# Patient Record
Sex: Female | Born: 1935 | Race: White | Hispanic: No | Smoking: Never smoker
Health system: Southern US, Community
[De-identification: ages and names within clinical notes are randomized; demographics above are authoritative.]

## PROBLEM LIST (undated history)

## (undated) DIAGNOSIS — M2550 Pain in unspecified joint: Secondary | ICD-10-CM

## (undated) DIAGNOSIS — R351 Nocturia: Secondary | ICD-10-CM

## (undated) DIAGNOSIS — H269 Unspecified cataract: Secondary | ICD-10-CM

## (undated) DIAGNOSIS — M1990 Unspecified osteoarthritis, unspecified site: Secondary | ICD-10-CM

## (undated) DIAGNOSIS — R011 Cardiac murmur, unspecified: Secondary | ICD-10-CM

---

## 2011-10-28 ENCOUNTER — Encounter (HOSPITAL_COMMUNITY): Payer: Self-pay | Admitting: Pharmacy Technician

## 2011-11-04 ENCOUNTER — Ambulatory Visit (HOSPITAL_COMMUNITY)
Admission: RE | Admit: 2011-11-04 | Discharge: 2011-11-04 | Disposition: A | Discharge status: Home / Self Care | Payer: Medicare Other | Source: Ambulatory Visit | Attending: Surgery | Admitting: Surgery

## 2011-11-04 ENCOUNTER — Encounter (HOSPITAL_COMMUNITY)
Admission: RE | Admit: 2011-11-04 | Discharge: 2011-11-04 | Disposition: A | Discharge status: Home / Self Care | Payer: Medicare Other | Source: Ambulatory Visit | Attending: Orthopedic Surgery | Admitting: Orthopedic Surgery

## 2011-11-04 ENCOUNTER — Encounter (HOSPITAL_COMMUNITY): Payer: Self-pay

## 2011-11-04 ENCOUNTER — Other Ambulatory Visit: Payer: Self-pay

## 2011-11-04 DIAGNOSIS — Z01810 Encounter for preprocedural cardiovascular examination: Secondary | ICD-10-CM | POA: Insufficient documentation

## 2011-11-04 DIAGNOSIS — K449 Diaphragmatic hernia without obstruction or gangrene: Secondary | ICD-10-CM | POA: Insufficient documentation

## 2011-11-04 DIAGNOSIS — Z01818 Encounter for other preprocedural examination: Secondary | ICD-10-CM | POA: Insufficient documentation

## 2011-11-04 DIAGNOSIS — Z01812 Encounter for preprocedural laboratory examination: Secondary | ICD-10-CM | POA: Insufficient documentation

## 2011-11-04 HISTORY — DX: Nocturia: R35.1

## 2011-11-04 HISTORY — DX: Unspecified cataract: H26.9

## 2011-11-04 HISTORY — DX: Cardiac murmur, unspecified: R01.1

## 2011-11-04 HISTORY — DX: Unspecified osteoarthritis, unspecified site: M19.90

## 2011-11-04 HISTORY — DX: Pain in unspecified joint: M25.50

## 2011-11-04 LAB — CBC
MCV: 86.6 fL (ref 78.0–100.0)
Platelets: 390 10*3/uL (ref 150–400)
RBC: 4.39 MIL/uL (ref 3.87–5.11)
RDW: 13.4 % (ref 11.5–15.5)
WBC: 11 10*3/uL — ABNORMAL HIGH (ref 4.0–10.5)

## 2011-11-04 LAB — URINALYSIS, ROUTINE W REFLEX MICROSCOPIC
Bilirubin Urine: NEGATIVE
Glucose, UA: NEGATIVE mg/dL
Ketones, ur: NEGATIVE mg/dL
Nitrite: NEGATIVE
Specific Gravity, Urine: 1.018 (ref 1.005–1.030)
pH: 6 (ref 5.0–8.0)

## 2011-11-04 LAB — COMPREHENSIVE METABOLIC PANEL
ALT: 13 U/L (ref 0–35)
AST: 16 U/L (ref 0–37)
Albumin: 3.7 g/dL (ref 3.5–5.2)
Alkaline Phosphatase: 106 U/L (ref 39–117)
CO2: 21 mEq/L (ref 19–32)
Chloride: 102 mEq/L (ref 96–112)
GFR calc non Af Amer: 70 mL/min — ABNORMAL LOW (ref >90)
Potassium: 3.7 mEq/L (ref 3.5–5.1)
Sodium: 136 mEq/L (ref 135–145)
Total Bilirubin: 0.3 mg/dL (ref 0.3–1.2)

## 2011-11-04 LAB — URINE MICROSCOPIC-ADD ON

## 2011-11-04 LAB — APTT: aPTT: 33 seconds (ref 24–37)

## 2011-11-04 LAB — TYPE AND SCREEN
ABO/RH(D): O POS
Antibody Screen: NEGATIVE

## 2011-11-04 LAB — SURGICAL PCR SCREEN: Staphylococcus aureus: POSITIVE — AB

## 2011-11-04 LAB — ABO/RH: ABO/RH(D): O POS

## 2011-11-04 NOTE — Progress Notes (Signed)
Pt has never had a stress test/echo/heart cath 

## 2011-11-04 NOTE — Pre-Procedure Instructions (Signed)
20 Courtney Proctor  11/04/2011   Your procedure is scheduled on:  Wed, Jan 30 @ 0830  Report to [facility] at 0630 AM.  Call this number if you have problems the morning of surgery: [phone]   Remember:   Do not eat food:After Midnight.  May have clear liquids: up to 4 Hours before arrival.(until 2:30 am)  Clear liquids include soda, tea, black coffee, apple or grape juice, broth.  Take these medicines the morning of surgery with A SIP OF WATER: Tramadol(if needed)   Do not wear jewelry, make-up or nail polish.  Do not wear lotions, powders, or perfumes. You may wear deodorant.  Do not shave 48 hours prior to surgery.  Do not bring valuables to the hospital.  Contacts, dentures or bridgework may not be worn into surgery.  Leave suitcase in the car. After surgery it may be brought to your room.  For patients admitted to the hospital, checkout time is 11:00 AM the day of discharge.   Patients discharged the day of surgery will not be allowed to drive home.  Name and phone number of your driver:   Special Instructions: CHG Shower Use Special Wash: 1/2 bottle night before surgery and 1/2 bottle morning of surgery.   Please read over the following fact sheets that you were given: Pain Booklet, Coughing and Deep Breathing, Blood Transfusion Information, Total Joint Packet, MRSA Information and Surgical Site Infection Prevention

## 2011-11-05 NOTE — H&P (Signed)
  [facility] [address]   [address] [place], [place] [address] [phone] A Division of [facility]  Loreta Ave, M.D.     Robert A. Thurston Hole, M.D.     Lunette Stands, M.D. Eulas Post, M.D.    Buford Dresser, M.D. Estell Harpin, M.D. Ralene Cork, D.O.          Genene Churn. Barry Dienes, PA-C            Kirstin A. Shepperson, PA-C [place], OPA-C   RE: Courtney Proctor, Courtney Proctor                                [id number]      DOB: 1936-04-02 PROGRESS NOTE: 10-31-11 Chief complaint: Left hip pain.  History of present illness: 62 three year-old white female with a history of end stage DJD, left hip, and chronic pain.  Returns.  States that hip symptoms are unchanged from previous visit.  She is wanting to proceed with total hip replacement as scheduled.  We received pre-op medical clearance from Dr. Renard Matter and she was last seen by him in early December.  No complaints of chest pain, shortness of breath, lightheadedness or dizziness.   Current medications: None listed. Allergies: No known drug allergies. Past medical/surgical history: None listed. Review of systems: All other systems are unremarkable.  See HPI.  Family history: Father had a brain tumor. Social history: Does not smoke or drink.  Patient is a widow.        EXAMINATION: Height: 4?11.  Weight: 156 pounds.  Pulse: 85.  Temperature: 98.3.  Blood pressure: Automatic 208/79 and rechecked 201/80.  Manual: 180/82 and rechecked 180/84.  Pleasant, elderly white female, alert and oriented x 3 and in no acute distress.  Head is normocephalic, a traumatic.  PERRLA, EOMI.  Cervical spine unremarkable.  Lungs: CTA bilaterally.  No wheezes.  Heart: RRR.  S1 and S2.  No murmurs.  Abdomen: Round and non-distended.  NBS x 4.  Soft and non-tender.  Left hip: Decreased range of  motion.  Positive log roll.  Neurovascularly intact.  Skin warm and dry.  Gait antalgic.       IMPRESSION: 1. End stage DJD, left hip, and chronic pain.  Failed conservative treatment.   2. Hypertension.  PLAN:  Recommended to patient and her daughter, who was present, that she needs follow up with Dr. Renard Matter next Monday for evaluation of her blood pressure.  I did call Dr. Renard Matter' office and they said it would be okay for her to be seen next week.  We will proceed with pre-op workup as scheduled.  All questions answered.    Loreta Ave, M.D.   Electronically verified by Loreta Ave, M.D. DFM(JMO):jjh D 11-03-11 T 11-03-11

## 2011-11-11 MED ORDER — CEFAZOLIN SODIUM 1-5 GM-% IV SOLN
1.0000 g | INTRAVENOUS | Status: AC
  Administered 2011-11-12: 1 g via INTRAVENOUS

## 2011-11-12 ENCOUNTER — Inpatient Hospital Stay (HOSPITAL_COMMUNITY)
Admission: RE | Admit: 2011-11-12 | Discharge: 2011-11-14 | DRG: 470 | Disposition: A | Discharge status: Home Health | Payer: Medicare Other | Source: Ambulatory Visit | Attending: Orthopedic Surgery | Admitting: Orthopedic Surgery

## 2011-11-12 ENCOUNTER — Encounter (HOSPITAL_COMMUNITY): Payer: Self-pay | Admitting: Anesthesiology

## 2011-11-12 ENCOUNTER — Encounter (HOSPITAL_COMMUNITY)
Admission: RE | Disposition: A | Discharge status: Home Health | Payer: Self-pay | Source: Ambulatory Visit | Attending: Orthopedic Surgery

## 2011-11-12 ENCOUNTER — Encounter (HOSPITAL_COMMUNITY): Payer: Self-pay | Admitting: Surgery

## 2011-11-12 ENCOUNTER — Ambulatory Visit (HOSPITAL_COMMUNITY): Payer: Medicare Other

## 2011-11-12 ENCOUNTER — Ambulatory Visit (HOSPITAL_COMMUNITY): Payer: Medicare Other | Admitting: Anesthesiology

## 2011-11-12 DIAGNOSIS — Z471 Aftercare following joint replacement surgery: Secondary | ICD-10-CM

## 2011-11-12 DIAGNOSIS — M169 Osteoarthritis of hip, unspecified: Principal | ICD-10-CM | POA: Diagnosis present

## 2011-11-12 DIAGNOSIS — Z01812 Encounter for preprocedural laboratory examination: Secondary | ICD-10-CM

## 2011-11-12 DIAGNOSIS — M1610 Unilateral primary osteoarthritis, unspecified hip: Principal | ICD-10-CM | POA: Diagnosis present

## 2011-11-12 DIAGNOSIS — I10 Essential (primary) hypertension: Secondary | ICD-10-CM | POA: Diagnosis present

## 2011-11-12 HISTORY — PX: TOTAL HIP ARTHROPLASTY: SHX124

## 2011-11-12 SURGERY — ARTHROPLASTY, HIP, TOTAL,POSTERIOR APPROACH
Anesthesia: General | Site: Hip | Laterality: Left | Wound class: Clean

## 2011-11-12 MED ORDER — HYDROMORPHONE HCL PF 1 MG/ML IJ SOLN: 0.2500 mg | INTRAMUSCULAR | Status: DC | PRN

## 2011-11-12 MED ORDER — ACETAMINOPHEN 325 MG PO TABS: 650.0000 mg | ORAL_TABLET | Freq: Four times a day (QID) | ORAL | Status: DC | PRN

## 2011-11-12 MED ORDER — MIDAZOLAM HCL 5 MG/5ML IJ SOLN
INTRAMUSCULAR | Status: DC | PRN
  Administered 2011-11-12: 1 mg via INTRAVENOUS

## 2011-11-12 MED ORDER — HYDROMORPHONE HCL PF 1 MG/ML IJ SOLN
0.5000 mg | INTRAMUSCULAR | Status: DC | PRN
  Administered 2011-11-13 (×2): 0.5 mg via INTRAVENOUS
  Filled 2011-11-12: qty 1

## 2011-11-12 MED ORDER — FENTANYL CITRATE 0.05 MG/ML IJ SOLN
INTRAMUSCULAR | Status: DC | PRN
  Administered 2011-11-12: 100 ug via INTRAVENOUS
  Administered 2011-11-12 (×5): 50 ug via INTRAVENOUS

## 2011-11-12 MED ORDER — MEPERIDINE HCL 25 MG/ML IJ SOLN: 6.2500 mg | INTRAMUSCULAR | Status: DC | PRN

## 2011-11-12 MED ORDER — PROPOFOL 10 MG/ML IV EMUL
INTRAVENOUS | Status: DC | PRN
  Administered 2011-11-12: 130 mg via INTRAVENOUS

## 2011-11-12 MED ORDER — WARFARIN VIDEO
Freq: Once | Status: AC
  Administered 2011-11-12: 22:00:00

## 2011-11-12 MED ORDER — NEOSTIGMINE METHYLSULFATE 1 MG/ML IJ SOLN
INTRAMUSCULAR | Status: DC | PRN
  Administered 2011-11-12: 4 mg via INTRAVENOUS

## 2011-11-12 MED ORDER — POTASSIUM CHLORIDE IN NACL 20-0.9 MEQ/L-% IV SOLN
INTRAVENOUS | Status: DC
  Administered 2011-11-12 – 2011-11-13 (×2): via INTRAVENOUS
  Filled 2011-11-12 (×5): qty 1000

## 2011-11-12 MED ORDER — SCOPOLAMINE 1 MG/3DAYS TD PT72
MEDICATED_PATCH | TRANSDERMAL | Status: DC | PRN
  Administered 2011-11-12: 1 via TRANSDERMAL

## 2011-11-12 MED ORDER — ACETAMINOPHEN 650 MG RE SUPP: 650.0000 mg | Freq: Four times a day (QID) | RECTAL | Status: DC | PRN

## 2011-11-12 MED ORDER — ROCURONIUM BROMIDE 100 MG/10ML IV SOLN
INTRAVENOUS | Status: DC | PRN
  Administered 2011-11-12: 40 mg via INTRAVENOUS

## 2011-11-12 MED ORDER — CEFAZOLIN SODIUM 1-5 GM-% IV SOLN
1.0000 g | Freq: Four times a day (QID) | INTRAVENOUS | Status: AC
  Administered 2011-11-12 – 2011-11-13 (×3): 1 g via INTRAVENOUS
  Filled 2011-11-12 (×3): qty 50

## 2011-11-12 MED ORDER — OXYCODONE-ACETAMINOPHEN 5-325 MG PO TABS
1.0000 | ORAL_TABLET | ORAL | Status: DC | PRN
  Administered 2011-11-12: 1 via ORAL
  Administered 2011-11-13 (×2): 2 via ORAL
  Administered 2011-11-13: 1 via ORAL
  Administered 2011-11-13 – 2011-11-14 (×4): 2 via ORAL
  Filled 2011-11-12: qty 2
  Filled 2011-11-12: qty 1
  Filled 2011-11-12 (×4): qty 2
  Filled 2011-11-12: qty 1
  Filled 2011-11-12: qty 2

## 2011-11-12 MED ORDER — DOCUSATE SODIUM 100 MG PO CAPS
100.0000 mg | ORAL_CAPSULE | Freq: Two times a day (BID) | ORAL | Status: DC
  Administered 2011-11-13 – 2011-11-14 (×2): 100 mg via ORAL
  Filled 2011-11-12 (×4): qty 1

## 2011-11-12 MED ORDER — FLEET ENEMA 7-19 GM/118ML RE ENEM: 1.0000 | ENEMA | Freq: Once | RECTAL | Status: AC | PRN

## 2011-11-12 MED ORDER — PROMETHAZINE HCL 25 MG/ML IJ SOLN: 6.2500 mg | INTRAMUSCULAR | Status: DC | PRN

## 2011-11-12 MED ORDER — ONDANSETRON HCL 4 MG PO TABS: 4.0000 mg | ORAL_TABLET | Freq: Four times a day (QID) | ORAL | Status: DC | PRN

## 2011-11-12 MED ORDER — PHENOL 1.4 % MT LIQD
1.0000 | OROMUCOSAL | Status: DC | PRN
  Filled 2011-11-12: qty 177

## 2011-11-12 MED ORDER — DEXAMETHASONE SODIUM PHOSPHATE 4 MG/ML IJ SOLN
INTRAMUSCULAR | Status: DC | PRN
  Administered 2011-11-12: 8 mg via INTRAVENOUS

## 2011-11-12 MED ORDER — ONDANSETRON HCL 4 MG/2ML IJ SOLN: 4.0000 mg | Freq: Four times a day (QID) | INTRAMUSCULAR | Status: DC | PRN

## 2011-11-12 MED ORDER — MENTHOL 3 MG MT LOZG: 1.0000 | LOZENGE | OROMUCOSAL | Status: DC | PRN

## 2011-11-12 MED ORDER — LACTATED RINGERS IV SOLN: INTRAVENOUS | Status: DC

## 2011-11-12 MED ORDER — LIDOCAINE HCL (CARDIAC) 20 MG/ML IV SOLN
INTRAVENOUS | Status: DC | PRN
  Administered 2011-11-12: 100 mg via INTRAVENOUS

## 2011-11-12 MED ORDER — GLYCOPYRROLATE 0.2 MG/ML IJ SOLN
INTRAMUSCULAR | Status: DC | PRN
Start: 2011-11-12 — End: 2011-11-12
  Administered 2011-11-12: .7 mg via INTRAVENOUS

## 2011-11-12 MED ORDER — MORPHINE SULFATE 10 MG/ML IJ SOLN: 0.0500 mg/kg | INTRAMUSCULAR | Status: DC | PRN

## 2011-11-12 MED ORDER — LACTATED RINGERS IV SOLN
INTRAVENOUS | Status: DC | PRN
  Administered 2011-11-12 (×2): via INTRAVENOUS

## 2011-11-12 MED ORDER — BUPIVACAINE HCL 0.5 % IJ SOLN
INTRAMUSCULAR | Status: DC | PRN
  Administered 2011-11-12: 20 mL

## 2011-11-12 MED ORDER — ENOXAPARIN SODIUM 40 MG/0.4ML ~~LOC~~ SOLN
40.0000 mg | Freq: Every day | SUBCUTANEOUS | Status: DC
  Administered 2011-11-12 – 2011-11-13 (×2): 40 mg via SUBCUTANEOUS
  Filled 2011-11-12 (×3): qty 0.4

## 2011-11-12 MED ORDER — MORPHINE SULFATE 2 MG/ML IJ SOLN: 0.0500 mg/kg | INTRAMUSCULAR | Status: DC | PRN

## 2011-11-12 MED ORDER — CHLORHEXIDINE GLUCONATE CLOTH 2 % EX PADS
6.0000 | MEDICATED_PAD | Freq: Every day | CUTANEOUS | Status: DC
  Administered 2011-11-13 – 2011-11-14 (×2): 6 via TOPICAL

## 2011-11-12 MED ORDER — SODIUM CHLORIDE 0.9 % IR SOLN
Status: DC | PRN
  Administered 2011-11-12: 1000 mL

## 2011-11-12 MED ORDER — METHOCARBAMOL 100 MG/ML IJ SOLN
500.0000 mg | Freq: Four times a day (QID) | INTRAVENOUS | Status: DC | PRN
  Filled 2011-11-12: qty 5

## 2011-11-12 MED ORDER — METHOCARBAMOL 500 MG PO TABS
500.0000 mg | ORAL_TABLET | Freq: Four times a day (QID) | ORAL | Status: DC | PRN
  Administered 2011-11-12 – 2011-11-13 (×3): 500 mg via ORAL
  Filled 2011-11-12 (×3): qty 1

## 2011-11-12 MED ORDER — WARFARIN SODIUM 5 MG PO TABS
5.0000 mg | ORAL_TABLET | Freq: Once | ORAL | Status: AC
  Administered 2011-11-12: 5 mg via ORAL
  Filled 2011-11-12: qty 1

## 2011-11-12 MED ORDER — MUPIROCIN 2 % EX OINT
1.0000 "application " | TOPICAL_OINTMENT | Freq: Two times a day (BID) | CUTANEOUS | Status: DC
  Administered 2011-11-11 – 2011-11-14 (×5): 1 via NASAL
  Filled 2011-11-12: qty 22

## 2011-11-12 MED ORDER — COUMADIN BOOK
Freq: Once | Status: AC
  Administered 2011-11-12: 22:00:00
  Filled 2011-11-12: qty 1

## 2011-11-12 SURGICAL SUPPLY — 62 items
BLADE SAW SAG 73X25 THK (BLADE) ×1
BLADE SAW SGTL 73X25 THK (BLADE) ×1 IMPLANT
BOOTCOVER CLEANROOM LRG (PROTECTIVE WEAR) ×4 IMPLANT
BRUSH FEMORAL CANAL (MISCELLANEOUS) IMPLANT
CLOTH BEACON ORANGE TIMEOUT ST (SAFETY) ×2 IMPLANT
COVER BACK TABLE 24X17X13 BIG (DRAPES) IMPLANT
COVER SURGICAL LIGHT HANDLE (MISCELLANEOUS) ×2 IMPLANT
DRAPE INCISE IOBAN 66X45 STRL (DRAPES) IMPLANT
DRAPE ORTHO SPLIT 77X108 STRL (DRAPES) ×2
DRAPE SURG ORHT 6 SPLT 77X108 (DRAPES) ×2 IMPLANT
DRAPE U-SHAPE 47X51 STRL (DRAPES) ×2 IMPLANT
DRILL BIT 7/64X5 (BIT) ×2 IMPLANT
DRSG PAD ABDOMINAL 8X10 ST (GAUZE/BANDAGES/DRESSINGS) ×2 IMPLANT
DURAPREP 26ML APPLICATOR (WOUND CARE) ×2 IMPLANT
ELECT BLADE 6.5 EXT (BLADE) IMPLANT
ELECT CAUTERY BLADE 6.4 (BLADE) ×2 IMPLANT
ELECT REM PT RETURN 9FT ADLT (ELECTROSURGICAL) ×2
ELECTRODE REM PT RTRN 9FT ADLT (ELECTROSURGICAL) ×1 IMPLANT
EVACUATOR 1/8 PVC DRAIN (DRAIN) IMPLANT
FACESHIELD LNG OPTICON STERILE (SAFETY) ×4 IMPLANT
GAUZE SPONGE 4X4 12PLY STRL LF (GAUZE/BANDAGES/DRESSINGS) ×2 IMPLANT
GAUZE XEROFORM 5X9 LF (GAUZE/BANDAGES/DRESSINGS) ×2 IMPLANT
GLOVE BIOGEL PI IND STRL 8 (GLOVE) ×1 IMPLANT
GLOVE BIOGEL PI INDICATOR 8 (GLOVE) ×1
GLOVE ORTHO TXT STRL SZ7.5 (GLOVE) ×4 IMPLANT
GOWN PREVENTION PLUS XLARGE (GOWN DISPOSABLE) ×2 IMPLANT
GOWN STRL NON-REIN LRG LVL3 (GOWN DISPOSABLE) ×2 IMPLANT
GOWN STRL REIN 2XL XLG LVL4 (GOWN DISPOSABLE) ×2 IMPLANT
HANDPIECE INTERPULSE COAX TIP (DISPOSABLE)
KIT BASIN OR (CUSTOM PROCEDURE TRAY) ×2 IMPLANT
KIT ROOM TURNOVER OR (KITS) ×2 IMPLANT
MANIFOLD NEPTUNE II (INSTRUMENTS) ×2 IMPLANT
NEEDLE HYPO 25GX1X1/2 BEV (NEEDLE) ×2 IMPLANT
NS IRRIG 1000ML POUR BTL (IV SOLUTION) ×2 IMPLANT
PACK TOTAL JOINT (CUSTOM PROCEDURE TRAY) ×2 IMPLANT
PAD ARMBOARD 7.5X6 YLW CONV (MISCELLANEOUS) ×4 IMPLANT
PASSER SUT SWANSON 36MM LOOP (INSTRUMENTS) ×2 IMPLANT
PILLOW ABDUCTION HIP (SOFTGOODS) ×2 IMPLANT
PRESSURIZER FEMORAL UNIV (MISCELLANEOUS) IMPLANT
SET HNDPC FAN SPRY TIP SCT (DISPOSABLE) IMPLANT
SPONGE GAUZE 4X4 12PLY (GAUZE/BANDAGES/DRESSINGS) ×2 IMPLANT
SPONGE LAP 4X18 X RAY DECT (DISPOSABLE) IMPLANT
STAPLER VISISTAT 35W (STAPLE) ×2 IMPLANT
SUCTION FRAZIER TIP 10 FR DISP (SUCTIONS) ×2 IMPLANT
SUT FIBERWIRE #2 38 REV NDL BL (SUTURE) ×6
SUT VIC AB 1 CTX 36 (SUTURE) ×4
SUT VIC AB 1 CTX36XBRD ANBCTR (SUTURE) ×4 IMPLANT
SUT VIC AB 2-0 CT1 27 (SUTURE)
SUT VIC AB 2-0 CT1 TAPERPNT 27 (SUTURE) IMPLANT
SUT VIC AB 2-0 SH 27 (SUTURE) ×2
SUT VIC AB 2-0 SH 27XBRD (SUTURE) ×2 IMPLANT
SUT VIC AB 3-0 SH 27 (SUTURE)
SUT VIC AB 3-0 SH 27X BRD (SUTURE) IMPLANT
SUTURE FIBERWR#2 38 REV NDL BL (SUTURE) ×3 IMPLANT
SYR 30ML SLIP (SYRINGE) ×2 IMPLANT
SYR CONTROL 10ML LL (SYRINGE) ×2 IMPLANT
TAPE CLOTH SURG 6X10 WHT LF (GAUZE/BANDAGES/DRESSINGS) ×2 IMPLANT
TOWEL OR 17X24 6PK STRL BLUE (TOWEL DISPOSABLE) ×2 IMPLANT
TOWEL OR 17X26 10 PK STRL BLUE (TOWEL DISPOSABLE) ×2 IMPLANT
TOWER CARTRIDGE SMART MIX (DISPOSABLE) IMPLANT
TRAY FOLEY CATH 14FR (SET/KITS/TRAYS/PACK) ×2 IMPLANT
WATER STERILE IRR 1000ML POUR (IV SOLUTION) ×8 IMPLANT

## 2011-11-12 NOTE — Interval H&P Note (Signed)
History and Physical Interval Note:  11/12/2011 8:11 AM  Courtney Proctor  has presented today for surgery, with the diagnosis of DJD LEFT HIP  The various methods of treatment have been discussed with the patient and family. After consideration of risks, benefits and other options for treatment, the patient has consented to  Procedure(s): TOTAL HIP ARTHROPLASTY as a surgical intervention .  The patients' history has been reviewed, patient examined, no change in status, stable for surgery.  I have reviewed the patients' chart and labs.  Questions were answered to the patient's satisfaction.     Geneive Sandstrom F

## 2011-11-12 NOTE — Op Note (Signed)
Courtney Proctor, Courtney Proctor              ACCOUNT NO.:  [id number]  MEDICAL RECORD NO.:  [id number]  LOCATION:  5032                         FACILITY:  [facility]  PHYSICIAN:  Loreta Ave, M.D. DATE OF BIRTH:  January 28, 1936  DATE OF PROCEDURE:  11/12/2011 DATE OF DISCHARGE:                              OPERATIVE REPORT   PREOPERATIVE DIAGNOSES:  End-stage degenerative arthritis, left hip. Marked collapse of femoral head and shortening.  POSTOPERATIVE DIAGNOSES:  End-stage degenerative arthritis, left hip. Marked collapse of femoral head and shortening.  PROCEDURE:  Left total hip replacement utilizing a Stryker secure fit prosthesis.  A press-fit 48 mm acetabular component.  A 32 mm internal diameter polyethylene liner 10 degree overhang.  Two screw fixation through the cup.  A press-fit #8 femoral component, 127 degree neck angle.  A 32 mm Biolox head plus 2.5.  SURGEON:  Loreta Ave, M.D.  ASSISTANT:  Zonia Kief, PA, present throughout the entire case, necessary for timely completion of procedure.  ANESTHESIA:  General.  BLOOD LOSS:  250 mL.  BLOOD GIVEN:  None.  SPECIMENS:  None.  COUNTS:  None.  COMPLICATION:  None.  Dressings soft compressive abduction pillow.  PROCEDURE:  The patient was brought to the operating room, placed on the operating table in the supine position.  After adequate anesthesia had been obtained, turned to a lateral position, left side up.  Prepped and draped in usual sterile fashion.  Incision along the shaft of femur extending posterosuperior.  Skin and subcu tissue divided.  Iliotibial band exposed and incised.  Charnley retractor put in place. Neurovascular structures identified, protected.  External rotator capsule taken down off the back of the intertrochanteric groove of the femur.  Tagged with FiberWire.  Hip dislocated posteriorly.  Marked shortening collapse especially in the femoral head.  There was more bone stock, however.   Femoral neck was cut 1 fingerbreadth above the lesser trochanter in line with definitive component.  Acetabulum exposed. Grade 4 change throughout.  Brought up to good bleeding bone. Sufficient inferior and medial placement for a good coverage of the cup. After appropriate trials, a 48 mm cup was hammered in place.  A 45 degrees of abduction, 20 degrees of anteversion.  Good capturing and fixation.  Augmented with 2 screws placed through the cup.  The 32 mm internal diameter liner with a 10 degree overhang posterosuperior inserted.  Attention was turned to femur.  Proximal and distal reamers and rasps brought the femur up to good filling proximally and distally. Size #8 component.  After appropriate trials, a #8 component was seated. With 127 degree neck angle and 32 mm +2.5 by Biolox head, I had excellent stability flexion extension.  Equal leg lengths.  We had been able to make up for her entire loss of length to good equal lengths. Wound irrigated.  External rotator capsule repaired to the back in the intertrochanteric groove through drill holes.  Sutures tied over bony bridge.  Wound irrigated.  Trial retractor removed.  Iliotibial band closed with 0 Vicryl.  Skin and subcu tissue with Vicryl and staples. Sterile compressive dressing was applied.  Prior to that, the margins were injected with Marcaine.  Returned  to supine position.  Abduction pillow placed.  Anesthesia reversed.  Brought to the recovery room. Tolerated surgery well.  No complications.     Loreta Ave, M.D.     DFM/MEDQ  D:  11/12/2011  T:  11/12/2011  Job:  [id number]

## 2011-11-12 NOTE — Anesthesia Postprocedure Evaluation (Signed)
  Anesthesia Post-op Note  Patient: Courtney Proctor  Procedure(s) Performed:  TOTAL HIP ARTHROPLASTY - 120 MINUTES FOR THIS SURGERY  Patient Location: PACU  Anesthesia Type: General  Level of Consciousness: awake  Airway and Oxygen Therapy: Patient Spontanous Breathing  Post-op Pain: mild  Post-op Assessment: Post-op Vital signs reviewed  Post-op Vital Signs: stable  Complications: No apparent anesthesia complications

## 2011-11-12 NOTE — Anesthesia Preprocedure Evaluation (Addendum)
Anesthesia Evaluation  Patient identified by MRN, date of birth, ID band Patient awake    Reviewed: Allergy & Precautions, H&P , NPO status   Airway Mallampati: II TM Distance: >3 FB Neck ROM: Full    Dental  (+) Edentulous Upper and Edentulous Lower   Pulmonary  clear to auscultation        Cardiovascular + Valvular Problems/Murmurs Regular Normal History of Rheumatic Fever.   Neuro/Psych    GI/Hepatic negative GI ROS, Neg liver ROS,   Endo/Other  Negative Endocrine ROS  Renal/GU negative Renal ROS     Musculoskeletal  (+) Arthritis -, Osteoarthritis,    Abdominal   Peds  Hematology negative hematology ROS (+)   Anesthesia Other Findings   Reproductive/Obstetrics                          Anesthesia Physical Anesthesia Plan  ASA: II  Anesthesia Plan: General   Post-op Pain Management:    Induction: Intravenous  Airway Management Planned: Oral ETT  Additional Equipment:   Intra-op Plan:   Post-operative Plan: Extubation in OR  Informed Consent: I have reviewed the patients History and Physical, chart, labs and discussed the procedure including the risks, benefits and alternatives for the proposed anesthesia with the patient or authorized representative who has indicated his/her understanding and acceptance.   Dental advisory given  Plan Discussed with: CRNA and Anesthesiologist  Anesthesia Plan Comments:        Anesthesia Quick Evaluation

## 2011-11-12 NOTE — Preoperative (Signed)
Beta Blockers   Reason not to administer Beta Blockers:Not Applicable 

## 2011-11-12 NOTE — Anesthesia Procedure Notes (Signed)
Procedure Name: Intubation Date/Time: 11/12/2011 8:44 AM Performed by: Shirlyn Goltz, TOM Pre-anesthesia Checklist: Patient identified, Emergency Drugs available, Suction available, Patient being monitored and Timeout performed Patient Re-evaluated:Patient Re-evaluated prior to inductionOxygen Delivery Method: Circle System Utilized Preoxygenation: Pre-oxygenation with 100% oxygen Ventilation: Mask ventilation without difficulty and Oral airway inserted - appropriate to patient size Laryngoscope Size: Mac and 4 Grade View: Grade I Tube type: Oral Tube size: 7.0 mm Number of attempts: 1 Airway Equipment and Method: stylet Placement Confirmation: ETT inserted through vocal cords under direct vision,  positive ETCO2 and breath sounds checked- equal and bilateral Secured at: 21 cm Tube secured with: Tape Dental Injury: Teeth and Oropharynx as per pre-operative assessment

## 2011-11-12 NOTE — Brief Op Note (Signed)
11/12/2011  11:50 AM  PATIENT:  Courtney Proctor  76 y.o. female  PRE-OPERATIVE DIAGNOSIS:  DJD LEFT HIP  POST-OPERATIVE DIAGNOSIS:  DJD LEFT HIP  PROCEDURE:  Procedure(s): Left TOTAL HIP ARTHROPLASTY  SURGEON:  Surgeon(s): Loreta Ave, MD  PHYSICIAN ASSISTANT: Zonia Kief M     ANESTHESIA:   general  EBL:  Total I/O In: 1500 [I.V.:1500] Out: 387 [Urine:100; Blood:287]  BLOOD ADMINISTERED:none  DRAINS: none     SPECIMEN:  No Specimen  DISPOSITION OF SPECIMEN:  N/A  COUNTS:  YES  TOURNIQUET:  * No tourniquets in log *  PATIENT DISPOSITION:  PACU - hemodynamically stable.

## 2011-11-12 NOTE — Transfer of Care (Signed)
Immediate Anesthesia Transfer of Care Note  Patient: Courtney Proctor  Procedure(s) Performed:  TOTAL HIP ARTHROPLASTY - 120 MINUTES FOR THIS SURGERY  Patient Location: PACU  Anesthesia Type: General  Level of Consciousness: awake, alert , oriented and patient cooperative  Airway & Oxygen Therapy: Patient Spontanous Breathing and Patient connected to nasal cannula oxygen  Post-op Assessment: Report given to PACU RN, Post -op Vital signs reviewed and stable and Patient moving all extremities X 4  Post vital signs: Reviewed and stable  Complications: No apparent anesthesia complications

## 2011-11-12 NOTE — Progress Notes (Signed)
ANTICOAGULATION CONSULT NOTE - Initial Consult  Pharmacy Consult for Coumadin Indication: VTE prophylaxis  No Known Allergies  Patient Measurements:   Heparin Dosing Weight:   Vital Signs: Temp: 97.6 F (36.4 C) (01/30 1030) Temp src: Oral (01/30 0629) BP: 150/69 mmHg (01/30 1210) Pulse Rate: 75  (01/30 1210)  Labs: No results found for this basename: HGB:2,HCT:3,PLT:3,APTT:3,LABPROT:3,INR:3,HEPARINUNFRC:3,CREATININE:3,CKTOTAL:3,CKMB:3,TROPONINI:3 in the last 72 hours CrCl is unknown because there is no height on file for the current visit.  Medical History: Past Medical History  Diagnosis Date  . Heart murmur     rhuematic fever at age 48;slight murmur from this  . Arthritis     hip  . Joint pain     left hip  . Hemorrhoids   . Nocturia   . Early cataracts, bilateral     Medications:  Prescriptions prior to admission  Medication Sig Dispense Refill  . ibuprofen (ADVIL) 200 MG tablet Take 200 mg by mouth every 6 (six) hours as needed. For hip pain      . Naproxen Sodium (ALEVE) 220 MG CAPS Take 220 mg by mouth daily as needed. For hip pain      . traMADol (ULTRAM) 50 MG tablet Take 50 mg by mouth 2 (two) times daily as needed. For pain        Assessment: 75yof to start Coumadin for VTE prophylaxis s/p THA. No anticoagulants are reported on pt's Med Rec. Pt is also receiving Lovenox 40mg  daily - noted orders to discontinue when INR >/= 1.8  - Baseline INR 1.1, AST/ALT wnl - No significant bleeding complications reported - Warfarin points: 2  Goal of Therapy:  INR 2-3   Plan:  1. Coumadin 5mg  po x 1 today 2. Daily PT/INR 3. Coumadin educational material  [name] [phone] 11/12/2011,1:16 PM

## 2011-11-13 ENCOUNTER — Encounter (HOSPITAL_COMMUNITY): Payer: Self-pay | Admitting: Orthopedic Surgery

## 2011-11-13 LAB — URINE MICROSCOPIC-ADD ON

## 2011-11-13 LAB — URINALYSIS, ROUTINE W REFLEX MICROSCOPIC
Glucose, UA: NEGATIVE mg/dL
pH: 7 (ref 5.0–8.0)

## 2011-11-13 LAB — BASIC METABOLIC PANEL
CO2: 23 mEq/L (ref 19–32)
Chloride: 103 mEq/L (ref 96–112)
Creatinine, Ser: 0.78 mg/dL (ref 0.50–1.10)
Sodium: 135 mEq/L (ref 135–145)

## 2011-11-13 LAB — CBC
MCV: 85.8 fL (ref 78.0–100.0)
Platelets: 316 10*3/uL (ref 150–400)
RBC: 3.44 MIL/uL — ABNORMAL LOW (ref 3.87–5.11)
WBC: 13.9 10*3/uL — ABNORMAL HIGH (ref 4.0–10.5)

## 2011-11-13 MED ORDER — WARFARIN SODIUM 4 MG PO TABS
4.0000 mg | ORAL_TABLET | Freq: Once | ORAL | Status: AC
  Administered 2011-11-13: 4 mg via ORAL
  Filled 2011-11-13: qty 1

## 2011-11-13 NOTE — Progress Notes (Signed)
Physical Therapy Treatment Patient Details Name: ZAYA KESSENICH MRN: [id number] DOB: [birth year] Today's Date: 11/13/2011  PT Assessment/Plan  PT - Assessment/Plan Comments on Treatment Session: Pt progressing very well in therapy, able to walk and perform there-ex with very little pain. Pt continues to walk with strong R trunk lean, confirmed to be premorbid per family. Pt likely to DC tomorrow, and will need to do stairs in the morning.  PT Plan: Discharge plan remains appropriate PT Frequency: 7X/week Follow Up Recommendations: Home health PT;Supervision/Assistance - 24 hour Equipment Recommended: None recommended by PT PT Goals  Acute Rehab PT Goals PT Goal Formulation: With patient/family PT Goal: Supine/Side to Sit - Progress: Met PT Goal: Sit to Supine/Side - Progress: Progressing toward goal PT Goal: Sit to Stand - Progress: Met PT Goal: Stand to Sit - Progress: Progressing toward goal PT Goal: Ambulate - Progress: Progressing toward goal PT Goal: Perform Home Exercise Program - Progress: Progressing toward goal  PT Treatment Precautions/Restrictions  Precautions Precautions: Posterior Hip;Fall Restrictions Weight Bearing Restrictions: Yes LLE Weight Bearing: Touchdown weight bearing Mobility (including Balance) Bed Mobility Bed Mobility: Yes Supine to Sit: 6: Modified independent (Device/Increase time);HOB flat;With rails Sit to Supine: 4: Min assist;HOB flat Sit to Supine - Details (indicate cue type and reason): Assist to lift L LE, cues for turning/scooting up in bed.  Transfers Sit to Stand: 5: Supervision;With upper extremity assist;From bed;From toilet Sit to Stand Details (indicate cue type and reason): S for safety, safe hand placement noted Stand to Sit: 4: Min assist;With upper extremity assist;To bed;To toilet Stand to Sit Details: Cues to complete turn with RW prior to sitting.  Ambulation/Gait Ambulation/Gait Assistance: 4: Min assist (Progressed to  close S) Ambulation/Gait Assistance Details (indicate cue type and reason): Improved UE weight bearing this tx, cues for TDBW. Educated daughter to stay on pt's R side while walking in case she looses balance.  Ambulation Distance (Feet): 80 Feet Assistive device: Rolling walker Gait Pattern: Step-through pattern;Decreased step length - right;Decreased stance time - left;Lateral trunk lean to right     End of Session PT - End of Session Equipment Utilized During Treatment: Gait belt Activity Tolerance: Patient tolerated treatment well Patient left: in bed;with call bell in reach;with family/visitor present Nurse Communication: Mobility status for ambulation General Behavior During Session: [facility] for tasks performed Cognition: [facility] for tasks performed  [facility] [place], [place] [phone]  11/13/2011, 4:16 PM

## 2011-11-13 NOTE — Progress Notes (Signed)
ANTICOAGULATION CONSULT NOTE - Follow Up Consult  Pharmacy Consult for Coumadin Indication: VTE prophylaxis  No Known Allergies  Patient Measurements: Height: 4\' 11"  (149.9 cm) Weight: 153 lb (69.4 kg) IBW/kg (Calculated) : 43.2  Heparin Dosing Weight:   Vital Signs: Temp: 98.9 F (37.2 C) (01/31 0548) Temp src: Oral (01/30 2233) BP: 142/74 mmHg (01/31 0548) Pulse Rate: 93  (01/31 0548)  Labs:  Basename 11/13/11 0630  HGB 9.8*  HCT 29.5*  PLT 316  APTT --  LABPROT 17.0*  INR 1.36  HEPARINUNFRC --  CREATININE 0.78  CKTOTAL --  CKMB --  TROPONINI --   Estimated Creatinine Clearance: 53.1 ml/min (by C-G formula based on Cr of 0.78).  Assessment: 75yof on Coumadin for VTE prophylaxis s/p THA. INR (1.36) is subtherapeutic as expected. Pt is also receiving Lovenox 40mg  daily until INR >/= 1.8. - H/H and Plts slight decrease - No significant bleeding reported  Goal of Therapy:  INR 2-3   Plan:  1. Coumadin 4mg  po x 1 today 2. Follow-up AM INR  Courtney Proctor [phone] 11/13/2011,10:00 AM

## 2011-11-13 NOTE — Evaluation (Signed)
Occupational Therapy Evaluation Patient Details Name: Courtney Proctor MRN: [id number] DOB: 1935-11-16 Today's Date: 11/13/2011  Problem List: There is no problem list on file for this patient.   Past Medical History:  Past Medical History  Diagnosis Date  . Heart murmur     rhuematic fever at age 76;slight murmur from this  . Arthritis     hip  . Joint pain     left hip  . Hemorrhoids   . Nocturia   . Early cataracts, bilateral    Past Surgical History:  Past Surgical History  Procedure Date  . Total hip arthroplasty 11/12/2011    Procedure: TOTAL HIP ARTHROPLASTY;  Surgeon: Loreta Ave, MD;  Location: [facility] OR;  Service: Orthopedics;  Laterality: Left;  120 MINUTES FOR THIS SURGERY    OT Assessment/Plan/Recommendation OT Assessment Clinical Impression Statement: This 76 y.o. female admitted for THA.  Pt. demonstrates the below listed deficits and will benefit from OT to maximize safety and independence with BADLs to allow pt. to return home with supervision level with family OT Recommendation/Assessment: Patient will need skilled OT in the acute care venue OT Problem List: Decreased strength;Decreased knowledge of use of DME or AE;Decreased knowledge of precautions;Pain Barriers to Discharge: None OT Therapy Diagnosis : Generalized weakness;Acute pain OT Plan OT Frequency: Min 2X/week OT Treatment/Interventions: Self-care/ADL training;DME and/or AE instruction;Therapeutic activities;Patient/family education OT Recommendation Follow Up Recommendations: No OT follow up;Supervision/Assistance - 24 hour Equipment Recommended: None recommended by OT Individuals Consulted Consulted and Agree with Results and Recommendations: Patient;Family member/caregiver OT Goals Acute Rehab OT Goals OT Goal Formulation: With patient Time For Goal Achievement: 7 days ADL Goals Pt Will Perform Grooming: with supervision;Standing at sink ADL Goal: Grooming - Progress: Goal set today Pt  Will Perform Lower Body Dressing: with supervision;with adaptive equipment;Sit to stand from chair ADL Goal: Lower Body Dressing - Progress: Goal set today Pt Will Transfer to Toilet: with supervision;Maintaining hip precautions;Ambulation ADL Goal: Toilet Transfer - Progress: Goal set today Pt Will Perform Toileting - Clothing Manipulation: with supervision;Standing ADL Goal: Toileting - Clothing Manipulation - Progress: Goal set today Pt Will Perform Toileting - Hygiene: with modified independence;Sitting on 3-in-1 or toilet Pt Will Perform Tub/Shower Transfer: with min assist;with DME;Maintaining hip precautions ADL Goal: Tub/Shower Transfer - Progress: Goal set today  OT Evaluation Precautions/Restrictions  Precautions Precautions: Posterior Hip;Fall Restrictions Weight Bearing Restrictions: Yes LLE Weight Bearing: Touchdown weight bearing Prior Functioning Home Living Lives With: Son (daughter will be assisting as necessary) Receives Help From: Family Type of Home: House Home Layout: One level Home Access: Stairs to enter Entrance Stairs-Rails: None Entrance Stairs-Number of Steps: 3 Bathroom Shower/Tub: Tub/shower unit;Door [employer] Toilet: Standard Bathroom Accessibility: Yes How Accessible: Accessible via walker Home Adaptive Equipment: Bedside commode/3-in-1;Walker - rolling;Quad cane;Straight cane;Reacher;Long-handled shoehorn;Long-handled sponge;Shower chair without back;Sock aid Prior Function Level of Independence: Requires assistive device for independence Able to Take Stairs?: Yes Driving: Yes Vocation: Retired ADL ADL Eating/Feeding: Simulated;Independent Where Assessed - Eating/Feeding: Chair Grooming: Simulated;Wash/dry hands;Wash/dry face;Teeth care;Minimal assistance Where Assessed - Grooming: Standing at sink Lower Body Bathing: Simulated;Minimal assistance Lower Body Bathing Details (indicate cue type and reason): using LH sponge Where Assessed -  Lower Body Bathing: Sit to stand from chair Lower Body Dressing: Performed;Minimal assistance Lower Body Dressing Details (indicate cue type and reason): using AE Where Assessed - Lower Body Dressing: Sit to stand from chair Toilet Transfer: Performed;Minimal assistance Toilet Transfer Method: Ambulating Toilet Transfer Equipment: Comfort height toilet;Grab bars Toileting - Clothing Manipulation:  Performed;Minimal assistance Where Assessed - Toileting Clothing Manipulation: Standing Toileting - Hygiene: Performed;Modified independent Where Assessed - Toileting Hygiene: Sit on 3-in-1 or toilet Equipment Used: Rolling walker Ambulation Related to ADLs: Pt. ambulates in room with min guard assist ADL Comments: Pt. instructed in use of AE for LB ADLs.  Dtr. will be able to assist as needed at discharge.   Vision/Perception    Cognition Cognition Arousal/Alertness: Awake/alert Overall Cognitive Status: Appears within functional limits for tasks assessed Orientation Level: Oriented X4 Sensation/Coordination Coordination Gross Motor Movements are Fluid and Coordinated: Yes Fine Motor Movements are Fluid and Coordinated: Yes Extremity Assessment RUE Assessment RUE Assessment: Within Functional Limits LUE Assessment LUE Assessment: Within Functional Limits Mobility  Bed Mobility Bed Mobility: Yes Supine to Sit: 6: Modified independent (Device/Increase time);HOB flat;With rails Sit to Supine: 4: Min assist;HOB flat Sit to Supine - Details (indicate cue type and reason): Assist to lift L LE, cues for turning/scooting up in bed.  Transfers Transfers: Yes Sit to Stand: 4: Min assist (min guard assist ) Sit to Stand Details (indicate cue type and reason): S for safety, safe hand placement noted Stand to Sit: 4: Min assist (min guard assist ) Stand to Sit Details: Cues to complete turn with RW prior to sitting.  Exercises   End of Session OT - End of Session Activity Tolerance: Patient  tolerated treatment well Patient left: in chair;with call bell in reach General Behavior During Session: [facility] for tasks performed Cognition: [facility] for tasks performed   Kaylene Dawn M 11/13/2011, 4:35 PM

## 2011-11-13 NOTE — Progress Notes (Signed)
Subjective: Doing well.  Pain controlled.  No complaints  Objective: Vital signs in last 24 hours: Temp:  [97.9 F (36.6 C)-98.9 F (37.2 C)] 98.9 F (37.2 C) (01/31 0548) Pulse Rate:  [72-93] 93  (01/31 0548) Resp:  [14-20] 20  (01/31 0548) BP: (107-178)/(61-79) 142/74 mmHg (01/31 0548) SpO2:  [98 %-100 %] 99 % (01/31 0548) Weight:  [69.4 kg (153 lb)] 69.4 kg (153 lb) (01/31 0700)  Intake/Output from previous day: 01/30 0701 - 01/31 0700 In: 2840 [P.O.:440; I.V.:2350; IV Piggyback:50] Out: 1437 [Urine:1150; Blood:287] Intake/Output this shift:     Basename 11/13/11 0630  HGB 9.8*    Basename 11/13/11 0630  WBC 13.9*  RBC 3.44*  HCT 29.5*  PLT 316    Basename 11/13/11 0630  NA 135  K 3.9  CL 103  CO2 23  BUN 8  CREATININE 0.78  GLUCOSE 128*  CALCIUM 8.4    Basename 11/13/11 0630  LABPT --  INR 1.36    Dressing cdi.  Calf nt, nvi.  Assessment/Plan: Anticipate d/c home tomorrow.  D/c dilaudid.   Rahil Passey M 11/13/2011, 11:38 AM

## 2011-11-13 NOTE — Progress Notes (Signed)
Physical Therapy Evaluation Patient Details Name: Courtney Proctor MRN: [id number] DOB: 1936-04-23 Today's Date: 11/13/2011  Problem List: There is no problem list on file for this patient.   Past Medical History:  Past Medical History  Diagnosis Date  . Heart murmur     rhuematic fever at age 76;slight murmur from this  . Arthritis     hip  . Joint pain     left hip  . Hemorrhoids   . Nocturia   . Early cataracts, bilateral    Past Surgical History:  Past Surgical History  Procedure Date  . Total hip arthroplasty 11/12/2011    Procedure: TOTAL HIP ARTHROPLASTY;  Surgeon: Loreta Ave, MD;  Location: [facility] OR;  Service: Orthopedics;  Laterality: Left;  120 MINUTES FOR THIS SURGERY    PT Assessment/Plan/Recommendation PT Assessment: Pt is admitted to [facility] for L THA. Pt presents with acute pain, decreased strength, and ROM, limiting gait and functional mobility. Pt will benefit from skilled PT to address these deficits and reduce falls risk and caregiver burden at home. At time of eval, pt requires cues for safety with all mobility, but is able to maintain TDWB and walk 71' with RW. Reviewed 3/3 hip precautions, and pt able to recall 2/3.  PT Recommendation/Assessment: Patient will need skilled PT in the acute care venue PT Problem List: Decreased strength;Decreased range of motion;Decreased activity tolerance;Decreased balance;Decreased mobility;Decreased knowledge of use of DME;Decreased safety awareness;Decreased knowledge of precautions;Pain Barriers to Discharge: Inaccessible home environment Barriers to Discharge Comments: 3 steps with no rail PT Therapy Diagnosis : Difficulty walking;Acute pain PT Plan PT Frequency: 7X/week PT Treatment/Interventions: DME instruction;Gait training;Stair training;Functional mobility training;Therapeutic activities;Therapeutic exercise;Balance training;Neuromuscular re-education;Patient/family education PT Recommendation Follow Up  Recommendations: Home health PT;Supervision/Assistance - 24 hour Equipment Recommended: None recommended by PT PT Goals  Acute Rehab PT Goals PT Goal Formulation: With patient/family Time For Goal Achievement: 7 days Pt will go Supine/Side to Sit: with modified independence;with HOB 0 degrees PT Goal: Supine/Side to Sit - Progress: Goal set today Pt will go Sit to Supine/Side: with modified independence;with HOB 0 degrees PT Goal: Sit to Supine/Side - Progress: Goal set today Pt will go Sit to Stand: with supervision;with upper extremity assist PT Goal: Sit to Stand - Progress: Goal set today Pt will go Stand to Sit: with supervision;with upper extremity assist PT Goal: Stand to Sit - Progress: Goal set today Pt will Ambulate: 51 - 150 feet;with supervision;with rolling walker (100' with proper gait pattern) PT Goal: Ambulate - Progress: Goal set today Pt will Go Up / Down Stairs: 3-5 stairs;with min assist;with least restrictive assistive device (Has been doing with cane and door frame) PT Goal: Up/Down Stairs - Progress: Goal set today Pt will Perform Home Exercise Program: with supervision, verbal cues required/provided PT Goal: Perform Home Exercise Program - Progress: Goal set today  PT Evaluation Precautions/Restrictions  Precautions Precautions: Posterior Hip;Fall Restrictions Weight Bearing Restrictions: Yes LLE Weight Bearing: Touchdown weight bearing Prior Functioning  Home Living Lives With: Son (Daughter will live there as well until not needed) Receives Help From: Family Type of Home: House Home Layout: One level Home Access: Stairs to enter Entrance Stairs-Rails: None (Steps are to get from converted carport/den to rest of house) Entrance Stairs-Number of Steps: 3 (Has been using cane and door frame ) Bathroom Shower/Tub: Tub/shower unit;Door [employer] Toilet: Standard Bathroom Accessibility: Yes How Accessible: Accessible via walker Home Adaptive Equipment:  Bedside commode/3-in-1;Walker - rolling;Quad cane;Straight cane;Reacher;Long-handled shoehorn;Long-handled sponge;Shower chair  without back;Sock aid Prior Function Level of Independence: Requires assistive device for independence (Does all own cooking, cleaning, dressing, bathing) Able to Take Stairs?: Yes Driving: Yes Vocation: Retired Financial risk analyst Arousal/Alertness: Awake/alert Overall Cognitive Status: Appears within functional limits for tasks assessed Orientation Level: Oriented X4 Sensation/Coordination Sensation Light Touch: Appears Intact Coordination Gross Motor Movements are Fluid and Coordinated: Yes Extremity Assessment RLE Assessment RLE Assessment: Within Functional Limits LLE Assessment LLE Assessment: Exceptions to [facility] LLE Strength LLE Overall Strength Comments: NT due to surgery, but noted 3/5 grossly throughout with functional mobility.  Mobility (including Balance) Bed Mobility Bed Mobility: Yes Supine to Sit: 5: Supervision;HOB flat;With rails Supine to Sit Details (indicate cue type and reason): Cues for sequencing and technique. Transfer very slow, but pt able to perform without assist Transfers Transfers: Yes Sit to Stand: 4: Min assist;With upper extremity assist;From bed Sit to Stand Details (indicate cue type and reason): Cues for safe hand placement and to have RW directly in front before standing. Cues for TDWB Stand to Sit: 4: Min assist;With upper extremity assist;To chair/3-in-1 Stand to Sit Details: Cues to complete turn with RW, maintain WB status, and safe hand placement. Ambulation/Gait Ambulation/Gait: Yes Ambulation/Gait Assistance: 3: Mod assist (Progressed to Min A) Ambulation/Gait Assistance Details (indicate cue type and reason): Pt initially moving quickly, unsafely, and not adherent to gait pattern taught, but eventually slowed and was able to maintain safe pattern with verbal and tactile cues. Pt tends to lean heavily on R side to  decrease L WB, and also likely habitual from PTA with L hip protruding laterally. Pt given verbal and tactile cues to distribute UE weight bearing more evenly to decrease falls risk, but also maintain TDWB. Pt able to adjust pattern with cues.  Ambulation Distance (Feet): 70 Feet Assistive device: Rolling walker Gait Pattern: Step-through pattern;Decreased step length - right;Decreased stance time - left;Lateral trunk lean to right Stairs: No Wheelchair Mobility Wheelchair Mobility: No  Posture/Postural Control Posture/Postural Control: Postural limitations Postural Limitations: Pt maintains R lean with trunk, but unable to assess if baseline or if recent to decrease pressure on surgical site.  Balance Balance Assessed: No Exercise  Total Joint Exercises Ankle Circles/Pumps: Supine;20 reps;Both;Strengthening;AROM Quad Sets: Supine;10 reps;Left;Strengthening;AROM Gluteal Sets: Supine;10 reps;Both;Strengthening;AROM (3 sec holds) End of Session PT - End of Session Equipment Utilized During Treatment: Gait belt Activity Tolerance: Patient tolerated treatment well Patient left: in chair;with call bell in reach;with family/visitor present Nurse Communication: Mobility status for transfers;Mobility status for ambulation;Weight bearing status General Behavior During Session: [facility] for tasks performed Cognition: [facility] for tasks performed  [facility], [place] [phone]  11/13/2011, 12:29 PM

## 2011-11-14 LAB — CBC
MCH: 29.2 pg (ref 26.0–34.0)
MCHC: 33.3 g/dL (ref 30.0–36.0)
Platelets: 273 10*3/uL (ref 150–400)

## 2011-11-14 LAB — BASIC METABOLIC PANEL
Calcium: 8.2 mg/dL — ABNORMAL LOW (ref 8.4–10.5)
GFR calc non Af Amer: 84 mL/min — ABNORMAL LOW (ref >90)
Sodium: 134 mEq/L — ABNORMAL LOW (ref 135–145)

## 2011-11-14 LAB — PROTIME-INR: Prothrombin Time: 23.4 seconds — ABNORMAL HIGH (ref 11.6–15.2)

## 2011-11-14 MED ORDER — BISACODYL 10 MG RE SUPP: 10.0000 mg | Freq: Once | RECTAL | Status: DC

## 2011-11-14 MED ORDER — DOCUSATE SODIUM 50 MG/5ML PO LIQD
100.0000 mg | Freq: Two times a day (BID) | ORAL | Status: DC
  Administered 2011-11-14 (×2): 100 mg via ORAL
  Filled 2011-11-14 (×2): qty 10

## 2011-11-14 NOTE — Progress Notes (Signed)
ANTICOAGULATION CONSULT NOTE - Follow Up Consult  Pharmacy Consult for Coumadin Indication: VTE prophylaxis  No Known Allergies  Patient Measurements: Height: 4\' 11"  (149.9 cm) Weight: 153 lb (69.4 kg) IBW/kg (Calculated) : 43.2  Heparin Dosing Weight:   Vital Signs: Temp: 99 F (37.2 C) (02/01 0551) Temp src: Oral (02/01 0551) BP: 143/52 mmHg (02/01 0551) Pulse Rate: 86  (02/01 0551)  Labs:  Basename 11/14/11 0445 11/13/11 0630  HGB 9.5* 9.8*  HCT 28.5* 29.5*  PLT 273 316  APTT -- --  LABPROT 23.4* 17.0*  INR 2.04* 1.36  HEPARINUNFRC -- --  CREATININE 0.70 0.78  CKTOTAL -- --  CKMB -- --  TROPONINI -- --   Estimated Creatinine Clearance: 53.1 ml/min (by C-G formula based on Cr of 0.7).  Assessment: 75yof on Coumadin for VTE prophylaxis s/p THA. Protime increased by 5.4 sec. Pt is also receiving Lovenox 40mg  daily until INR >/= 1.8. - H/H and Plts slight decrease - No significant bleeding reported  Goal of Therapy:  INR 2-3   Plan:  1. No coumadin today, dc lovenox. 2. Follow-up AM INR  Talbert Cage [place] [phone] 11/14/2011,11:12 AM

## 2011-11-14 NOTE — Progress Notes (Signed)
Physical Therapy Treatment Patient Details Name: SHEMIA BEVEL MRN: [id number] DOB: December 04, 1935 Today's Date: 11/14/2011  PT Assessment/Plan  PT - Assessment/Plan Comments on Treatment Session: Pt continues to progress well in therapy, able to perform stairs with Min A and walk 150' with RW.  PT Plan: Discharge plan remains appropriate PT Frequency: 7X/week Follow Up Recommendations: Home health PT;Supervision/Assistance - 24 hour Equipment Recommended: None recommended by PT PT Goals  Acute Rehab PT Goals PT Goal Formulation: With patient/family PT Goal: Sit to Stand - Progress: Met PT Goal: Stand to Sit - Progress: Met PT Goal: Ambulate - Progress: Met PT Goal: Up/Down Stairs - Progress: Met PT Goal: Perform Home Exercise Program - Progress: Progressing toward goal  PT Treatment Precautions/Restrictions  Precautions Precautions: Posterior Hip;Fall Restrictions Weight Bearing Restrictions: Yes LLE Weight Bearing: Touchdown weight bearing Mobility (including Balance) Bed Mobility Bed Mobility: Yes Supine to Sit: 6: Modified independent (Device/Increase time);HOB flat;With rails Transfers Sit to Stand: 6: Modified independent (Device/Increase time) Sit to Stand Details (indicate cue type and reason): Increased time, safe hand placement noted  Stand to Sit: 5: Supervision;With upper extremity assist;To chair/3-in-1;To toilet Stand to Sit Details: S due to guarded descent Ambulation/Gait Ambulation/Gait Assistance: 5: Supervision Ambulation/Gait Assistance Details (indicate cue type and reason): Pt with improved upright posture today, less R turnk lean, but still antalgic, especially with fatigue. Cues given to attempt heel strike as able.  Ambulation Distance (Feet): 150 Feet Assistive device: Rolling walker Gait Pattern: Step-through pattern;Decreased stride length;Lateral trunk lean to right Stairs: Yes Stairs Assistance: 4: Min assist Stairs Assistance Details (indicate  cue type and reason): Assist for steadying. ues for sequence with quad cane in R hand ascending. At home, pt has couch and door frame on L ascending for support, as well as assist from daughter. They discussed building rail Stair Management Technique: One rail Left;Step to pattern;Forwards (Rail=couch and door frame) Number of Stairs: 3  Height of Stairs: 6  Wheelchair Mobility Wheelchair Mobility: No  Posture/Postural Control Posture/Postural Control: No significant limitations Postural Limitations: Improved this tx Exercise  Total Joint Exercises Ankle Circles/Pumps: Supine;20 reps;Both;Strengthening;AROM Gluteal Sets: Supine;10 reps;Both;Strengthening;AROM Heel Slides: Supine;10 reps;Left;Strengthening;AAROM Hip ABduction/ADduction: Supine;10 reps;Left;Strengthening;AAROM Long Arc Quad: Seated;10 reps;Left;Strengthening;AROM End of Session PT - End of Session Equipment Utilized During Treatment: Gait belt Activity Tolerance: Patient tolerated treatment well Patient left: in chair;with call bell in reach;with family/visitor present Nurse Communication: Mobility status for transfers;Mobility status for ambulation General Behavior During Session: [facility] for tasks performed Cognition: [facility] for tasks performed  [facility] [place], [place] [phone]  11/14/2011, 10:58 AM

## 2011-11-14 NOTE — Progress Notes (Signed)
PT Cancellation Note  Treatment cancelled today due to pt requesting to rest before d/cing home.  Pt states "Im just waiting on my papers".    Lara Mulch 11/14/2011, 1:17 PM

## 2011-11-14 NOTE — Progress Notes (Signed)
Occupational Therapy Treatment Patient Details Name: Courtney Proctor MRN: [id number] DOB: July 12, 1936 Today's Date: 11/14/2011  OT Assessment/Plan OT Assessment/Plan Comments on Treatment Session: Pt. is progressing well.  She is eager to discharge home with family.  Pt. demonstrates good understanding of THR precautions, and use of AE.   OT Plan: Discharge plan remains appropriate OT Frequency: Min 2X/week Follow Up Recommendations: No OT follow up;Supervision/Assistance - 24 hour Equipment Recommended: None recommended by OT OT Goals ADL Goals ADL Goal: Lower Body Dressing - Progress: Met ADL Goal: Toilet Transfer - Progress: Met ADL Goal: Toileting - Clothing Manipulation - Progress: Met ADL Goal: Tub/Shower Transfer - Progress: Discontinued (comment)  OT Treatment Precautions/Restrictions  Precautions Precautions: Posterior Hip;Fall Restrictions Weight Bearing Restrictions: Yes LLE Weight Bearing: Weight bearing as tolerated   ADL ADL Lower Body Bathing: Simulated;Supervision/safety Lower Body Bathing Details (indicate cue type and reason): using LH sponge Where Assessed - Lower Body Bathing: Sit to stand from chair Lower Body Dressing: Performed;Supervision/safety Lower Body Dressing Details (indicate cue type and reason): using AE Where Assessed - Lower Body Dressing: Sit to stand from chair Toilet Transfer: Performed;Supervision/safety (min cues for THR precautions) Toilet Transfer Method: Freight forwarder Equipment: Raised toilet seat with arms (or 3-in-1 over toilet) Toileting - Clothing Manipulation: Performed;Supervision/safety Where Assessed - Toileting Clothing Manipulation: Standing Toileting - Hygiene: Performed;Modified independent Where Assessed - Toileting Hygiene: Sit on 3-in-1 or toilet Tub/Shower Transfer:  (Pt. plans to sponge bathe) Equipment Used: Rolling walker;Long-handled sponge;Reacher;Sock aid Ambulation Related to ADLs: Pt. ambulated  in room with supervision and good awareness of precautions ADL Comments: Pt. able to demonstrate understanding of use of AE for LB ADLs, Dtr. present for session Mobility  Bed Mobility Bed Mobility: Yes Supine to Sit: 6: Modified independent (Device/Increase time);HOB flat;With rails Transfers Transfers: Yes Sit to Stand: 6: Modified independent (Device/Increase time) Sit to Stand Details (indicate cue type and reason): Increased time, safe hand placement noted  Stand to Sit: 5: Supervision;With upper extremity assist;To chair/3-in-1;To toilet Stand to Sit Details: S due to guarded descent Exercises End of Session OT - End of Session Activity Tolerance: Patient tolerated treatment well Patient left: in chair;with call bell in reach General Behavior During Session: [facility] for tasks performed Cognition: [facility] for tasks performed  Courtney Proctor M  11/14/2011, 1:17 PM

## 2011-11-14 NOTE — Progress Notes (Signed)
Met with pt re d/c need, pt setup with [facility] for [facility] and pt has Rolling walker and 3:1 at home, no further needs identified.  CRoyal RN MPH

## 2011-11-14 NOTE — Progress Notes (Signed)
Subjective: Doing well.  Pain controlled.  Wants to go home.  No bm yet.  Objective: Vital signs in last 24 hours: Temp:  [99 F (37.2 C)-99.4 F (37.4 C)] 99 F (37.2 C) (02/01 0551) Pulse Rate:  [86-97] 86  (02/01 0551) Resp:  [16-18] 18  (02/01 0551) BP: (132-143)/(52-57) 143/52 mmHg (02/01 0551) SpO2:  [97 %-99 %] 98 % (02/01 0551)  Intake/Output from previous day: 01/31 0701 - 02/01 0700 In: 1300 [P.O.:960; I.V.:340] Out: 500 [Urine:500] Intake/Output this shift:     Basename 11/14/11 0445 11/13/11 0630  HGB 9.5* 9.8*    Basename 11/14/11 0445 11/13/11 0630  WBC 13.2* 13.9*  RBC 3.25* 3.44*  HCT 28.5* 29.5*  PLT 273 316    Basename 11/14/11 0445 11/13/11 0630  NA 134* 135  K 3.4* 3.9  CL 103 103  CO2 21 23  BUN 6 8  CREATININE 0.70 0.78  GLUCOSE 122* 128*  CALCIUM 8.2* 8.4    Basename 11/14/11 0445 11/13/11 0630  LABPT -- --  INR 2.04* 1.36    Wound looks good.  Staples intact.  No drainage or signs of infection.  Calf nt, nvi.  Assessment/Plan: D/c home today after has bm.  F/u 2 weeks postop.     Ark Agrusa M 11/14/2011, 11:10 AM

## 2011-12-10 NOTE — Discharge Summary (Signed)
  ABBREVIATED DISCHARGE SUMMARY      DATE OF HOSPITALIZATION: 12 Nov 2011  REASON FOR HOSPITALIZATION:  75 YO WF WITH HX END STAGE DJD LEFT HIP AND CHRONIC PAIN.    SIGNIFICANT FINDINGS:  DJD  OPERATION:  LEFT THR  FINAL DIAGNOSIS:  SAME  SECONDARY DIAGNOSIS: none  CONSULTANTS:  none  DISCHARGE CONDITION:  STABLE  DISCHARGED TO:  HOME

## 2013-04-26 ENCOUNTER — Encounter (HOSPITAL_COMMUNITY): Payer: Self-pay | Admitting: Pharmacy Technician

## 2013-05-02 ENCOUNTER — Encounter (HOSPITAL_COMMUNITY): Payer: Self-pay

## 2013-05-02 ENCOUNTER — Encounter (HOSPITAL_COMMUNITY)
Admission: RE | Admit: 2013-05-02 | Discharge: 2013-05-02 | Disposition: A | Discharge status: Home / Self Care | Payer: Medicare Other | Source: Ambulatory Visit | Attending: Ophthalmology | Admitting: Ophthalmology

## 2013-05-02 ENCOUNTER — Other Ambulatory Visit: Payer: Self-pay

## 2013-05-02 DIAGNOSIS — Z01812 Encounter for preprocedural laboratory examination: Secondary | ICD-10-CM | POA: Insufficient documentation

## 2013-05-02 DIAGNOSIS — Z01810 Encounter for preprocedural cardiovascular examination: Secondary | ICD-10-CM | POA: Insufficient documentation

## 2013-05-02 LAB — BASIC METABOLIC PANEL
Calcium: 9 mg/dL (ref 8.4–10.5)
GFR calc Af Amer: 76 mL/min — ABNORMAL LOW (ref >90)
GFR calc non Af Amer: 66 mL/min — ABNORMAL LOW (ref >90)
Potassium: 4 mEq/L (ref 3.5–5.1)
Sodium: 138 mEq/L (ref 135–145)

## 2013-05-02 LAB — SURGICAL PCR SCREEN
MRSA, PCR: POSITIVE — AB
Staphylococcus aureus: POSITIVE — AB

## 2013-05-02 LAB — HEMOGLOBIN AND HEMATOCRIT, BLOOD
HCT: 36.6 % (ref 36.0–46.0)
Hemoglobin: 12.8 g/dL (ref 12.0–15.0)

## 2013-05-02 NOTE — Pre-Procedure Instructions (Signed)
Patient in for PAT. BP 193/96. Patient denies hypertension, on no meds. States that everytime she come to the hospital her BP gos up and that the last time she was in hospital she was sent to Dr Megan Mans office after her PAT and her BP was normal there. Sent patient to see Dr Megan Mans about BP. SHe verbalized understanding of this and states she will go by there on her way home.

## 2013-05-02 NOTE — Patient Instructions (Addendum)
Your procedure is scheduled on: 05/09/2013  Report to [facility] at  630   AM.  Call this number if you have problems the morning of surgery: [phone]   Do not eat food or drink liquids :After Midnight.      Take these medicines the morning of surgery with A SIP OF WATER: none   Do not wear jewelry, make-up or nail polish.  Do not wear lotions, powders, or perfumes.   Do not shave 48 hours prior to surgery.  Do not bring valuables to the hospital.  Contacts, dentures or bridgework may not be worn into surgery.  Leave suitcase in the car. After surgery it may be brought to your room.  For patients admitted to the hospital, checkout time is 11:00 AM the day of discharge.   Patients discharged the day of surgery will not be allowed to drive home.  :     Please read over the following fact sheets that you were given: Coughing and Deep Breathing, Surgical Site Infection Prevention, Anesthesia Post-op Instructions and Care and Recovery After Surgery    Cataract A cataract is a clouding of the lens of the eye. When a lens becomes cloudy, vision is reduced based on the degree and nature of the clouding. Many cataracts reduce vision to some degree. Some cataracts make people more near-sighted as they develop. Other cataracts increase glare. Cataracts that are ignored and become worse can sometimes look white. The white color can be seen through the pupil. CAUSES   Aging. However, cataracts may occur at any age, even in newborns.   Certain drugs.   Trauma to the eye.   Certain diseases such as diabetes.   Specific eye diseases such as chronic inflammation inside the eye or a sudden attack of a rare form of glaucoma.   Inherited or acquired medical problems.  SYMPTOMS   Gradual, progressive drop in vision in the affected eye.   Severe, rapid visual loss. This most often happens when trauma is the cause.  DIAGNOSIS  To detect a cataract, an eye doctor examines the lens. Cataracts are  best diagnosed with an exam of the eyes with the pupils enlarged (dilated) by drops.  TREATMENT  For an early cataract, vision may improve by using different eyeglasses or stronger lighting. If that does not help your vision, surgery is the only effective treatment. A cataract needs to be surgically removed when vision loss interferes with your everyday activities, such as driving, reading, or watching TV. A cataract may also have to be removed if it prevents examination or treatment of another eye problem. Surgery removes the cloudy lens and usually replaces it with a substitute lens (intraocular lens, IOL).  At a time when both you and your doctor agree, the cataract will be surgically removed. If you have cataracts in both eyes, only one is usually removed at a time. This allows the operated eye to heal and be out of danger from any possible problems after surgery (such as infection or poor wound healing). In rare cases, a cataract may be doing damage to your eye. In these cases, your caregiver may advise surgical removal right away. The vast majority of people who have cataract surgery have better vision afterward. HOME CARE INSTRUCTIONS  If you are not planning surgery, you may be asked to do the following:  Use different eyeglasses.   Use stronger or brighter lighting.   Ask your eye doctor about reducing your medicine dose or changing medicines  if it is thought that a medicine caused your cataract. Changing medicines does not make the cataract go away on its own.   Become familiar with your surroundings. Poor vision can lead to injury. Avoid bumping into things on the affected side. You are at a higher risk for tripping or falling.   Exercise extreme care when driving or operating machinery.   Wear sunglasses if you are sensitive to bright light or experiencing problems with glare.  SEEK IMMEDIATE MEDICAL CARE IF:   You have a worsening or sudden vision loss.   You notice redness,  swelling, or increasing pain in the eye.   You have a fever.  Document Released: 09/29/2005 Document Revised: 09/18/2011 Document Reviewed: 05/23/2011 [facility] Patient Information 2012 [place].PATIENT INSTRUCTIONS POST-ANESTHESIA  IMMEDIATELY FOLLOWING SURGERY:  Do not drive or operate machinery for the first twenty four hours after surgery.  Do not make any important decisions for twenty four hours after surgery or while taking narcotic pain medications or sedatives.  If you develop intractable nausea and vomiting or a severe headache please notify your doctor immediately.  FOLLOW-UP:  Please make an appointment with your surgeon as instructed. You do not need to follow up with anesthesia unless specifically instructed to do so.  WOUND CARE INSTRUCTIONS (if applicable):  Keep a dry clean dressing on the anesthesia/puncture wound site if there is drainage.  Once the wound has quit draining you may leave it open to air.  Generally you should leave the bandage intact for twenty four hours unless there is drainage.  If the epidural site drains for more than 36-48 hours please call the anesthesia department.  QUESTIONS?:  Please feel free to call your physician or the hospital operator if you have any questions, and they will be happy to assist you.

## 2013-05-05 ENCOUNTER — Encounter (HOSPITAL_COMMUNITY): Payer: Self-pay | Admitting: Pharmacy Technician

## 2013-05-06 MED ORDER — NEOMYCIN-POLYMYXIN-DEXAMETH 3.5-10000-0.1 OP OINT
TOPICAL_OINTMENT | OPHTHALMIC | Status: AC
  Filled 2013-05-06: qty 3.5

## 2013-05-06 MED ORDER — LIDOCAINE HCL (PF) 1 % IJ SOLN
INTRAMUSCULAR | Status: AC
  Filled 2013-05-06: qty 2

## 2013-05-06 MED ORDER — CYCLOPENTOLATE-PHENYLEPHRINE 0.2-1 % OP SOLN
OPHTHALMIC | Status: AC
  Filled 2013-05-06: qty 2

## 2013-05-06 MED ORDER — TETRACAINE HCL 0.5 % OP SOLN
OPHTHALMIC | Status: AC
  Filled 2013-05-06: qty 2

## 2013-05-06 MED ORDER — LIDOCAINE HCL 3.5 % OP GEL
OPHTHALMIC | Status: AC
  Filled 2013-05-06: qty 5

## 2013-05-09 ENCOUNTER — Encounter (HOSPITAL_COMMUNITY)
Admission: RE | Disposition: A | Discharge status: Home / Self Care | Payer: Self-pay | Source: Ambulatory Visit | Attending: Ophthalmology

## 2013-05-09 ENCOUNTER — Ambulatory Visit (HOSPITAL_COMMUNITY)
Admission: RE | Admit: 2013-05-09 | Discharge: 2013-05-09 | Disposition: A | Discharge status: Home / Self Care | Payer: Medicare Other | Source: Ambulatory Visit | Attending: Ophthalmology | Admitting: Ophthalmology

## 2013-05-09 ENCOUNTER — Ambulatory Visit (HOSPITAL_COMMUNITY): Payer: Medicare Other | Admitting: Anesthesiology

## 2013-05-09 ENCOUNTER — Encounter (HOSPITAL_COMMUNITY): Payer: Self-pay | Admitting: *Deleted

## 2013-05-09 ENCOUNTER — Encounter (HOSPITAL_COMMUNITY): Payer: Self-pay | Admitting: Anesthesiology

## 2013-05-09 DIAGNOSIS — H2589 Other age-related cataract: Secondary | ICD-10-CM | POA: Insufficient documentation

## 2013-05-09 HISTORY — PX: CATARACT EXTRACTION W/PHACO: SHX586

## 2013-05-09 SURGERY — PHACOEMULSIFICATION, CATARACT, WITH IOL INSERTION
Anesthesia: Monitor Anesthesia Care | Site: Eye | Laterality: Right | Wound class: Clean

## 2013-05-09 MED ORDER — LACTATED RINGERS IV SOLN
INTRAVENOUS | Status: DC
  Administered 2013-05-09: 08:00:00 via INTRAVENOUS

## 2013-05-09 MED ORDER — PROVISC 10 MG/ML IO SOLN
INTRAOCULAR | Status: DC | PRN
  Administered 2013-05-09: 8.5 mg via INTRAOCULAR

## 2013-05-09 MED ORDER — NEOMYCIN-POLYMYXIN-DEXAMETH 0.1 % OP OINT
TOPICAL_OINTMENT | OPHTHALMIC | Status: DC | PRN
  Administered 2013-05-09: 1 via OPHTHALMIC

## 2013-05-09 MED ORDER — LIDOCAINE 3.5 % OP GEL OPTIME - NO CHARGE
OPHTHALMIC | Status: DC | PRN
  Administered 2013-05-09: 2 [drp] via OPHTHALMIC

## 2013-05-09 MED ORDER — PHENYLEPHRINE HCL 2.5 % OP SOLN
1.0000 [drp] | OPHTHALMIC | Status: AC
  Administered 2013-05-09 (×3): 1 [drp] via OPHTHALMIC

## 2013-05-09 MED ORDER — LIDOCAINE HCL (PF) 1 % IJ SOLN
INTRAMUSCULAR | Status: DC | PRN
  Administered 2013-05-09: .4 mL

## 2013-05-09 MED ORDER — CYCLOPENTOLATE-PHENYLEPHRINE 0.2-1 % OP SOLN
1.0000 [drp] | OPHTHALMIC | Status: AC
  Administered 2013-05-09 (×3): 1 [drp] via OPHTHALMIC

## 2013-05-09 MED ORDER — LIDOCAINE HCL 3.5 % OP GEL: 1.0000 "application " | Freq: Once | OPHTHALMIC | Status: DC

## 2013-05-09 MED ORDER — TETRACAINE HCL 0.5 % OP SOLN
1.0000 [drp] | OPHTHALMIC | Status: AC
  Administered 2013-05-09 (×3): 1 [drp] via OPHTHALMIC

## 2013-05-09 MED ORDER — POVIDONE-IODINE 5 % OP SOLN
OPHTHALMIC | Status: DC | PRN
  Administered 2013-05-09: 1 via OPHTHALMIC

## 2013-05-09 MED ORDER — MIDAZOLAM HCL 2 MG/2ML IJ SOLN
1.0000 mg | INTRAMUSCULAR | Status: DC | PRN
  Administered 2013-05-09: 2 mg via INTRAVENOUS

## 2013-05-09 MED ORDER — EPINEPHRINE HCL 1 MG/ML IJ SOLN
INTRAOCULAR | Status: DC | PRN
  Administered 2013-05-09: 08:00:00

## 2013-05-09 MED ORDER — BSS IO SOLN
INTRAOCULAR | Status: DC | PRN
  Administered 2013-05-09: 15 mL via INTRAOCULAR

## 2013-05-09 MED ORDER — LACTATED RINGERS IV SOLN
INTRAVENOUS | Status: DC | PRN
  Administered 2013-05-09: 07:00:00 via INTRAVENOUS

## 2013-05-09 MED ORDER — MIDAZOLAM HCL 2 MG/2ML IJ SOLN
INTRAMUSCULAR | Status: AC
  Filled 2013-05-09: qty 2

## 2013-05-09 SURGICAL SUPPLY — 32 items

## 2013-05-09 NOTE — Op Note (Signed)
Date of Admission: 05/09/2013  Date of Surgery: 05/09/2013  Pre-Op Dx: Cataract  Right  Eye  Post-Op Dx: Cataract  Right  Eye,  Dx Code 366.19   Surgeon: Gemma Payor, M.D.  Assistants: None  Anesthesia: Topical with MAC  Indications: Painless, progressive loss of vision with compromise of daily activities.  Surgery: Cataract Extraction with Intraocular lens Implant Right Eye  Discription: The patient had dilating drops and viscous lidocaine placed into the left eye in the pre-op holding area. After transfer to the operating room, a time out was performed. The patient was then prepped and draped. Beginning with a 75 degree blade a paracentesis port was made at the surgeon's 2 o'clock position. The anterior chamber was then filled with 1% non-preserved lidocaine. This was followed by filling the anterior chamber with Provisc. A bent cystatome needle was used to create a continuous tear capsulotomy. Hydrodissection was performed with balanced salt solution on a Fine canula. The lens nucleus was then removed using the phacoemulsification handpiece. Residual cortex was removed with the I&A handpiece. The anterior chamber and capsular bag were refilled with Provisc. A posterior chamber intraocular lens was placed into the capsular bag with it's injector. The implant was positioned with the Kuglan hook. The Provisc was then removed from the anterior chamber and capsular bag with the I&A handpiece. Stromal hydration of the main incision and paracentesis port was performed with BSS on a Fine canula. The wounds were tested for leak which was negative. The patient tolerated the procedure well. There were no operative complications. The patient was then transferred to the recovery room in stable condition.  Complications: None  Specimen: None  EBL: None  Prosthetic device: B&L enVista, MX60, power 22.5D, SN [id number].

## 2013-05-09 NOTE — Anesthesia Postprocedure Evaluation (Signed)
  Anesthesia Post-op Note  Patient: Courtney Proctor  Procedure(s) Performed: Procedure(s) with comments: CATARACT EXTRACTION PHACO AND INTRAOCULAR LENS PLACEMENT (IOC) (Right) - CDE 28.35  Patient Location: Short Stay  Anesthesia Type:MAC  Level of Consciousness: awake, alert , oriented and patient cooperative  Airway and Oxygen Therapy: Patient Spontanous Breathing  Post-op Pain: none  Post-op Assessment: Post-op Vital signs reviewed, Patient's Cardiovascular Status Stable, Respiratory Function Stable, RESPIRATORY FUNCTION UNSTABLE, No signs of Nausea or vomiting and Pain level controlled  Post-op Vital Signs: Reviewed and stable  Complications: No apparent anesthesia complications

## 2013-05-09 NOTE — Preoperative (Signed)
Beta Blockers   Reason not to administer Beta Blockers:Not Applicable 

## 2013-05-09 NOTE — Anesthesia Preprocedure Evaluation (Signed)
Anesthesia Evaluation  Patient identified by MRN, date of birth, ID band Patient awake    Reviewed: Allergy & Precautions, H&P , NPO status   Airway Mallampati: II TM Distance: >3 FB Neck ROM: Full    Dental  (+) Edentulous Upper and Edentulous Lower   Pulmonary neg pulmonary ROS,  breath sounds clear to auscultation        Cardiovascular negative cardio ROS  + Valvular Problems/Murmurs Rhythm:Regular Rate:Normal  History of Rheumatic Fever.   Neuro/Psych    GI/Hepatic negative GI ROS, Neg liver ROS,   Endo/Other  negative endocrine ROS  Renal/GU negative Renal ROS     Musculoskeletal  (+) Arthritis -, Osteoarthritis,    Abdominal   Peds  Hematology negative hematology ROS (+)   Anesthesia Other Findings   Reproductive/Obstetrics                           Anesthesia Physical Anesthesia Plan  ASA: II  Anesthesia Plan: MAC   Post-op Pain Management:    Induction: Intravenous  Airway Management Planned: Nasal Cannula  Additional Equipment:   Intra-op Plan:   Post-operative Plan:   Informed Consent: I have reviewed the patients History and Physical, chart, labs and discussed the procedure including the risks, benefits and alternatives for the proposed anesthesia with the patient or authorized representative who has indicated his/her understanding and acceptance.     Plan Discussed with:   Anesthesia Plan Comments:         Anesthesia Quick Evaluation

## 2013-05-09 NOTE — Transfer of Care (Signed)
Immediate Anesthesia Transfer of Care Note  Patient: Courtney Proctor  Procedure(s) Performed: Procedure(s) with comments: CATARACT EXTRACTION PHACO AND INTRAOCULAR LENS PLACEMENT (IOC) (Right) - CDE 28.35  Patient Location: Short stay  Anesthesia Type:MAC  Level of Consciousness: awake, alert , oriented and patient cooperative  Airway & Oxygen Therapy: Patient Spontanous Breathing  Post-op Assessment: Report given to PACU RN and Post -op Vital signs reviewed and stable  Post vital signs: Reviewed and stable  Complications: No apparent anesthesia complications

## 2013-05-09 NOTE — Anesthesia Procedure Notes (Signed)
Procedure Name: MAC Date/Time: 05/09/2013 8:00 AM Performed by: Carolyne Littles, AMY L Pre-anesthesia Checklist: Patient identified, Timeout performed, Emergency Drugs available, Suction available and Patient being monitored Patient Re-evaluated:Patient Re-evaluated prior to inductionOxygen Delivery Method: Nasal cannula

## 2013-05-09 NOTE — H&P (Signed)
I have reviewed the H&P, the patient was re-examined, and I have identified no interval changes in medical condition and plan of care since the history and physical of record  

## 2013-05-10 ENCOUNTER — Encounter (HOSPITAL_COMMUNITY): Payer: Self-pay | Admitting: Ophthalmology

## 2013-05-11 ENCOUNTER — Encounter (HOSPITAL_COMMUNITY)
Admission: RE | Admit: 2013-05-11 | Discharge: 2013-05-11 | Disposition: A | Discharge status: Home / Self Care | Payer: Medicare Other | Source: Ambulatory Visit | Attending: Ophthalmology | Admitting: Ophthalmology

## 2013-05-11 MED ORDER — FENTANYL CITRATE 0.05 MG/ML IJ SOLN: 25.0000 ug | INTRAMUSCULAR | Status: DC | PRN

## 2013-05-11 MED ORDER — ONDANSETRON HCL 4 MG/2ML IJ SOLN: 4.0000 mg | Freq: Once | INTRAMUSCULAR | Status: AC | PRN

## 2013-05-18 MED ORDER — CYCLOPENTOLATE-PHENYLEPHRINE 0.2-1 % OP SOLN
OPHTHALMIC | Status: AC
  Filled 2013-05-18: qty 2

## 2013-05-18 MED ORDER — TETRACAINE HCL 0.5 % OP SOLN
OPHTHALMIC | Status: AC
  Filled 2013-05-18: qty 2

## 2013-05-18 MED ORDER — NEOMYCIN-POLYMYXIN-DEXAMETH 3.5-10000-0.1 OP OINT
TOPICAL_OINTMENT | OPHTHALMIC | Status: AC
  Filled 2013-05-18: qty 3.5

## 2013-05-18 MED ORDER — LIDOCAINE HCL (PF) 1 % IJ SOLN
INTRAMUSCULAR | Status: AC
  Filled 2013-05-18: qty 2

## 2013-05-18 MED ORDER — LIDOCAINE HCL 3.5 % OP GEL
OPHTHALMIC | Status: AC
  Filled 2013-05-18: qty 5

## 2013-05-18 MED ORDER — PHENYLEPHRINE HCL 2.5 % OP SOLN
OPHTHALMIC | Status: AC
  Filled 2013-05-18: qty 2

## 2013-05-19 ENCOUNTER — Ambulatory Visit (HOSPITAL_COMMUNITY)
Admission: RE | Admit: 2013-05-19 | Discharge: 2013-05-19 | Disposition: A | Discharge status: Home Health | Payer: Medicare Other | Source: Ambulatory Visit | Attending: Ophthalmology | Admitting: Ophthalmology

## 2013-05-19 ENCOUNTER — Encounter (HOSPITAL_COMMUNITY): Payer: Self-pay | Admitting: Anesthesiology

## 2013-05-19 ENCOUNTER — Ambulatory Visit (HOSPITAL_COMMUNITY): Payer: Medicare Other | Admitting: Anesthesiology

## 2013-05-19 ENCOUNTER — Encounter (HOSPITAL_COMMUNITY)
Admission: RE | Disposition: A | Discharge status: Home Health | Payer: Self-pay | Source: Ambulatory Visit | Attending: Ophthalmology

## 2013-05-19 ENCOUNTER — Encounter (HOSPITAL_COMMUNITY): Payer: Self-pay | Admitting: *Deleted

## 2013-05-19 DIAGNOSIS — H2510 Age-related nuclear cataract, unspecified eye: Secondary | ICD-10-CM | POA: Insufficient documentation

## 2013-05-19 HISTORY — PX: CATARACT EXTRACTION W/PHACO: SHX586

## 2013-05-19 SURGERY — PHACOEMULSIFICATION, CATARACT, WITH IOL INSERTION
Anesthesia: Monitor Anesthesia Care | Site: Eye | Laterality: Left | Wound class: Clean

## 2013-05-19 MED ORDER — LACTATED RINGERS IV SOLN
INTRAVENOUS | Status: DC
  Administered 2013-05-19: 08:00:00 via INTRAVENOUS

## 2013-05-19 MED ORDER — EPINEPHRINE HCL 1 MG/ML IJ SOLN
INTRAMUSCULAR | Status: AC
  Filled 2013-05-19: qty 1

## 2013-05-19 MED ORDER — BSS IO SOLN
INTRAOCULAR | Status: DC | PRN
  Administered 2013-05-19: 15 mL via INTRAOCULAR

## 2013-05-19 MED ORDER — PROVISC 10 MG/ML IO SOLN
INTRAOCULAR | Status: DC | PRN
  Administered 2013-05-19: 8.5 mg via INTRAOCULAR

## 2013-05-19 MED ORDER — EPINEPHRINE HCL 1 MG/ML IJ SOLN
INTRAOCULAR | Status: DC | PRN
  Administered 2013-05-19: 09:00:00

## 2013-05-19 MED ORDER — POVIDONE-IODINE 5 % OP SOLN
OPHTHALMIC | Status: DC | PRN
  Administered 2013-05-19: 1 via OPHTHALMIC

## 2013-05-19 MED ORDER — LIDOCAINE HCL 3.5 % OP GEL
1.0000 "application " | Freq: Once | OPHTHALMIC | Status: AC
  Administered 2013-05-19: 1 via OPHTHALMIC

## 2013-05-19 MED ORDER — MIDAZOLAM HCL 2 MG/2ML IJ SOLN
INTRAMUSCULAR | Status: AC
  Filled 2013-05-19: qty 2

## 2013-05-19 MED ORDER — LIDOCAINE 3.5 % OP GEL OPTIME - NO CHARGE
OPHTHALMIC | Status: DC | PRN
  Administered 2013-05-19: 1 [drp] via OPHTHALMIC

## 2013-05-19 MED ORDER — TETRACAINE HCL 0.5 % OP SOLN
1.0000 [drp] | OPHTHALMIC | Status: AC
  Administered 2013-05-19 (×3): 1 [drp] via OPHTHALMIC

## 2013-05-19 MED ORDER — CYCLOPENTOLATE-PHENYLEPHRINE 0.2-1 % OP SOLN
1.0000 [drp] | OPHTHALMIC | Status: AC
  Administered 2013-05-19 (×3): 1 [drp] via OPHTHALMIC

## 2013-05-19 MED ORDER — LIDOCAINE HCL (PF) 1 % IJ SOLN
INTRAMUSCULAR | Status: DC | PRN
  Administered 2013-05-19: .4 mL

## 2013-05-19 MED ORDER — NEOMYCIN-POLYMYXIN-DEXAMETH 0.1 % OP OINT
TOPICAL_OINTMENT | OPHTHALMIC | Status: DC | PRN
  Administered 2013-05-19: 1 via OPHTHALMIC

## 2013-05-19 MED ORDER — MIDAZOLAM HCL 2 MG/2ML IJ SOLN
1.0000 mg | INTRAMUSCULAR | Status: DC | PRN
  Administered 2013-05-19: 2 mg via INTRAVENOUS

## 2013-05-19 MED ORDER — PHENYLEPHRINE HCL 2.5 % OP SOLN
1.0000 [drp] | OPHTHALMIC | Status: AC
  Administered 2013-05-19 (×3): 1 [drp] via OPHTHALMIC

## 2013-05-19 SURGICAL SUPPLY — 32 items

## 2013-05-19 NOTE — Anesthesia Preprocedure Evaluation (Signed)
Anesthesia Evaluation  Patient identified by MRN, date of birth, ID band Patient awake    Reviewed: Allergy & Precautions, H&P , NPO status   Airway Mallampati: II TM Distance: >3 FB Neck ROM: Full    Dental  (+) Edentulous Upper and Edentulous Lower   Pulmonary neg pulmonary ROS,  breath sounds clear to auscultation        Cardiovascular negative cardio ROS  + Valvular Problems/Murmurs Rhythm:Regular Rate:Normal  History of Rheumatic Fever.   Neuro/Psych    GI/Hepatic negative GI ROS, Neg liver ROS,   Endo/Other  negative endocrine ROS  Renal/GU negative Renal ROS     Musculoskeletal  (+) Arthritis -, Osteoarthritis,    Abdominal   Peds  Hematology negative hematology ROS (+)   Anesthesia Other Findings   Reproductive/Obstetrics                           Anesthesia Physical Anesthesia Plan  ASA: II  Anesthesia Plan: MAC   Post-op Pain Management:    Induction: Intravenous  Airway Management Planned: Nasal Cannula  Additional Equipment:   Intra-op Plan:   Post-operative Plan:   Informed Consent: I have reviewed the patients History and Physical, chart, labs and discussed the procedure including the risks, benefits and alternatives for the proposed anesthesia with the patient or authorized representative who has indicated his/her understanding and acceptance.     Plan Discussed with:   Anesthesia Plan Comments:         Anesthesia Quick Evaluation  

## 2013-05-19 NOTE — Anesthesia Procedure Notes (Signed)
Procedure Name: MAC Date/Time: 05/19/2013 8:28 AM Performed by: Franco Nones Pre-anesthesia Checklist: Patient identified, Emergency Drugs available, Suction available, Timeout performed and Patient being monitored Patient Re-evaluated:Patient Re-evaluated prior to inductionOxygen Delivery Method: Nasal Cannula

## 2013-05-19 NOTE — H&P (Signed)
I have reviewed the H&P, the patient was re-examined, and I have identified no interval changes in medical condition and plan of care since the history and physical of record  

## 2013-05-19 NOTE — Op Note (Signed)
Date of Admission: 05/19/2013  Date of Surgery: 05/19/2013  Pre-Op Dx: Cataract  Left  Eye  Post-Op Dx: Cataract  Left  Eye,  Dx Code 366.16  Surgeon: Gemma Payor, M.D.  Assistants: None  Anesthesia: Topical with MAC  Indications: Painless, progressive loss of vision with compromise of daily activities.  Surgery: Cataract Extraction with Intraocular lens Implant Left Eye  Discription: The patient had dilating drops and viscous lidocaine placed into the left eye in the pre-op holding area. After transfer to the operating room, a time out was performed. The patient was then prepped and draped. Beginning with a 75 degree blade a paracentesis port was made at the surgeon's 2 o'clock position. The anterior chamber was then filled with 1% non-preserved lidocaine. This was followed by filling the anterior chamber with Provisc. A bent cystatome needle was used to create a continuous tear capsulotomy. Hydrodissection was performed with balanced salt solution on a Fine canula. The lens nucleus was then removed using the phacoemulsification handpiece. Residual cortex was removed with the I&A handpiece. The anterior chamber and capsular bag were refilled with Provisc. A posterior chamber intraocular lens was placed into the capsular bag with it's injector. The implant was positioned with the Kuglan hook. The Provisc was then removed from the anterior chamber and capsular bag with the I&A handpiece. Stromal hydration of the main incision and paracentesis port was performed with BSS on a Fine canula. The wounds were tested for leak which was negative. The patient tolerated the procedure well. There were no operative complications. The patient was then transferred to the recovery room in stable condition.  Complications: None  Specimen: None  EBL: None  Prosthetic device: B&L enVista, MX60, power 22.0D, SN [id number].

## 2013-05-19 NOTE — Anesthesia Postprocedure Evaluation (Signed)
  Anesthesia Post-op Note  Patient: Courtney Proctor  Procedure(s) Performed: Procedure(s) (LRB): CATARACT EXTRACTION PHACO AND INTRAOCULAR LENS PLACEMENT (IOC) (Left)  Patient Location:  Short Stay  Anesthesia Type: MAC  Level of Consciousness: awake  Airway and Oxygen Therapy: Patient Spontanous Breathing  Post-op Pain: none  Post-op Assessment: Post-op Vital signs reviewed, Patient's Cardiovascular Status Stable, Respiratory Function Stable, Patent Airway, No signs of Nausea or vomiting and Pain level controlled  Post-op Vital Signs: Reviewed and stable  Complications: No apparent anesthesia complications

## 2013-05-19 NOTE — Transfer of Care (Signed)
Immediate Anesthesia Transfer of Care Note  Patient: Courtney Proctor  Procedure(s) Performed: Procedure(s) (LRB): CATARACT EXTRACTION PHACO AND INTRAOCULAR LENS PLACEMENT (IOC) (Left)  Patient Location: Shortstay  Anesthesia Type: MAC  Level of Consciousness: awake  Airway & Oxygen Therapy: Patient Spontanous Breathing   Post-op Assessment: Report given to PACU RN, Post -op Vital signs reviewed and stable and Patient moving all extremities  Post vital signs: Reviewed and stable  Complications: No apparent anesthesia complications

## 2013-05-20 ENCOUNTER — Encounter (HOSPITAL_COMMUNITY): Payer: Self-pay | Admitting: Ophthalmology

## 2014-03-13 ENCOUNTER — Encounter (HOSPITAL_COMMUNITY): Payer: Self-pay

## 2014-03-14 ENCOUNTER — Other Ambulatory Visit: Payer: Self-pay | Admitting: Physician Assistant

## 2014-03-14 NOTE — H&P (Signed)
TOTAL HIP ADMISSION H&P  Patient is admitted for right total hip arthroplasty.  Subjective:  Chief Complaint: right hip pain  HPI: Courtney Proctor, 77 y.o. female, has a history of pain and functional disability in the right hip(s) due to trauma and patient has failed non-surgical conservative treatments for greater than 12 weeks to include NSAID's and/or analgesics, use of assistive devices and activity modification.  Onset of symptoms was gradual starting 1 years ago with gradually worsening course since that time.The patient noted no past surgery on the right hip(s).  Patient currently rates pain in the right hip at 10 out of 10 with activity. Patient has night pain, worsening of pain with activity and weight bearing, trendelenberg gait, pain that interfers with activities of daily living, pain with passive range of motion and joint swelling. Patient has evidence of subchondral sclerosis and joint space narrowing by imaging studies. This condition presents safety issues increasing the risk of falls.  There is no current active infection.  There are no active problems to display for this patient.  Past Medical History  Diagnosis Date  . Heart murmur     rhuematic fever at age 8;slight murmur from this  . Arthritis     hip  . Joint pain     left hip  . Hemorrhoids   . Nocturia   . Early cataracts, bilateral     Past Surgical History  Procedure Laterality Date  . Total hip arthroplasty  11/12/2011    Procedure: TOTAL HIP ARTHROPLASTY;  Surgeon: Daniel F Murphy, MD;  Location: [facility] OR;  Service: Orthopedics;  Laterality: Left;  120 MINUTES FOR THIS SURGERY  . Cataract extraction w/phaco Right 05/09/2013    Procedure: CATARACT EXTRACTION PHACO AND INTRAOCULAR LENS PLACEMENT (IOC);  Surgeon: Kerry Hunt, MD;  Location: [facility] ORS;  Service: Ophthalmology;  Laterality: Right;  CDE 28.35  . Cataract extraction w/phaco Left 05/19/2013    Procedure: CATARACT EXTRACTION PHACO AND INTRAOCULAR LENS  PLACEMENT (IOC);  Surgeon: Kerry Hunt, MD;  Location: [facility];  Service: Ophthalmology;  Laterality: Left;  CDE:28.11     (Not in a hospital admission) No Known Allergies  History  Substance Use Topics  . Smoking status: Never Smoker   . Smokeless tobacco: Not on file  . Alcohol Use: No    Family History  Problem Relation Age of Onset  . Anesthesia problems Neg Hx   . Hypotension Neg Hx   . Malignant hyperthermia Neg Hx   . Pseudochol deficiency Neg Hx      Review of Systems  Constitutional: Negative.   HENT: Negative.   Eyes: Negative.   Respiratory: Negative.   Cardiovascular: Negative.   Gastrointestinal: Negative.   Genitourinary: Negative.   Musculoskeletal: Positive for joint pain.  Skin: Negative.   Neurological: Negative.   Endo/Heme/Allergies: Negative.   Psychiatric/Behavioral: Negative.     Objective:  Physical Exam  Constitutional: She is oriented to person, place, and time. She appears well-developed and well-nourished.  HENT:  Head: Normocephalic and atraumatic.  Eyes: EOM are normal. Pupils are equal, round, and reactive to light.  Neck: Normal range of motion. Neck supple.  Cardiovascular: Normal rate and regular rhythm.  Exam reveals no gallop and no friction rub.   Murmur heard. Respiratory: Effort normal and breath sounds normal.  GI: Soft. Bowel sounds are normal.  Musculoskeletal:  General exam is outlined and included in the chart.  Specifically, markedly antalgic Trendelenburg gait on the right.  She uses a cane in   her left hand.  The left hip negative log roll, great motion, good strength.  On the right, virtually no motion with rotation.  Very little abduction.  Neurovascularly intact distally.   Neurological: She is alert and oriented to person, place, and time.  Skin: Skin is warm and dry.  Psychiatric: She has a normal mood and affect. Her behavior is normal. Judgment and thought content normal.    Vital signs in last 24  hours: @VSRANGES@  Labs:   Estimated body mass index is 31.38 kg/(m^2) as calculated from the following:   Height as of 05/19/13: 5' 1" (1.549 m).   Weight as of 05/02/13: 75.297 kg (166 lb).   Imaging Review Plain radiographs demonstrate severe degenerative joint disease of the right hip(s). The bone quality appears to be fair for age and reported activity level.  Assessment/Plan:  End stage arthritis, right hip(s)  The patient history, physical examination, clinical judgement of the provider and imaging studies are consistent with end stage degenerative joint disease of the right hip(s) and total hip arthroplasty is deemed medically necessary. The treatment options including medical management, injection therapy, arthroscopy and arthroplasty were discussed at length. The risks and benefits of total hip arthroplasty were presented and reviewed. The risks due to aseptic loosening, infection, stiffness, dislocation/subluxation,  thromboembolic complications and other imponderables were discussed.  The patient acknowledged the explanation, agreed to proceed with the plan and consent was signed. Patient is being admitted for inpatient treatment for surgery, pain control, PT, OT, prophylactic antibiotics, VTE prophylaxis, progressive ambulation and ADL's and discharge planning.The patient is planning to be discharged home with home health services 

## 2014-03-15 ENCOUNTER — Encounter (HOSPITAL_COMMUNITY)
Admission: RE | Admit: 2014-03-15 | Discharge: 2014-03-15 | Disposition: A | Discharge status: Home / Self Care | Payer: Medicare Other | Source: Ambulatory Visit | Attending: Orthopedic Surgery | Admitting: Orthopedic Surgery

## 2014-03-15 ENCOUNTER — Encounter (HOSPITAL_COMMUNITY)
Admission: RE | Admit: 2014-03-15 | Discharge: 2014-03-15 | Disposition: A | Discharge status: Home / Self Care | Payer: Medicare Other | Source: Ambulatory Visit | Attending: Physician Assistant | Admitting: Physician Assistant

## 2014-03-15 DIAGNOSIS — R011 Cardiac murmur, unspecified: Secondary | ICD-10-CM | POA: Insufficient documentation

## 2014-03-15 DIAGNOSIS — Z01812 Encounter for preprocedural laboratory examination: Secondary | ICD-10-CM | POA: Diagnosis present

## 2014-03-15 DIAGNOSIS — Z01818 Encounter for other preprocedural examination: Secondary | ICD-10-CM | POA: Insufficient documentation

## 2014-03-15 LAB — URINALYSIS, ROUTINE W REFLEX MICROSCOPIC

## 2014-03-15 LAB — SURGICAL PCR SCREEN
MRSA, PCR: NEGATIVE
STAPHYLOCOCCUS AUREUS: POSITIVE — AB

## 2014-03-15 LAB — PROTIME-INR
INR: 1 (ref 0.00–1.49)
Prothrombin Time: 13 seconds (ref 11.6–15.2)

## 2014-03-15 LAB — COMPREHENSIVE METABOLIC PANEL
ALT: 11 U/L (ref 0–35)
AST: 16 U/L (ref 0–37)
Albumin: 3.8 g/dL (ref 3.5–5.2)
Alkaline Phosphatase: 111 U/L (ref 39–117)
BUN: 9 mg/dL (ref 6–23)
CALCIUM: 9.4 mg/dL (ref 8.4–10.5)
CO2: 22 mEq/L (ref 19–32)
CREATININE: 0.91 mg/dL (ref 0.50–1.10)
Chloride: 102 mEq/L (ref 96–112)
GFR calc Af Amer: 70 mL/min — ABNORMAL LOW (ref >90)
GFR calc non Af Amer: 60 mL/min — ABNORMAL LOW (ref >90)
Glucose, Bld: 108 mg/dL — ABNORMAL HIGH (ref 70–99)
Potassium: 3.8 mEq/L (ref 3.7–5.3)
SODIUM: 138 meq/L (ref 137–147)
TOTAL PROTEIN: 8.4 g/dL — AB (ref 6.0–8.3)
Total Bilirubin: 0.4 mg/dL (ref 0.3–1.2)

## 2014-03-15 LAB — CBC WITH DIFFERENTIAL/PLATELET
BASOS PCT: 0 % (ref 0–1)
Basophils Absolute: 0 10*3/uL (ref 0.0–0.1)
EOS ABS: 0 10*3/uL (ref 0.0–0.7)
EOS PCT: 0 % (ref 0–5)
HCT: 39 % (ref 36.0–46.0)
Hemoglobin: 13 g/dL (ref 12.0–15.0)
LYMPHS PCT: 21 % (ref 12–46)
Lymphs Abs: 2 10*3/uL (ref 0.7–4.0)
MCH: 28.7 pg (ref 26.0–34.0)
MCHC: 33.3 g/dL (ref 30.0–36.0)
MCV: 86.1 fL (ref 78.0–100.0)
Monocytes Absolute: 0.6 10*3/uL (ref 0.1–1.0)
Monocytes Relative: 6 % (ref 3–12)
Neutro Abs: 6.8 10*3/uL (ref 1.7–7.7)
Neutrophils Relative %: 73 % (ref 43–77)
PLATELETS: 408 10*3/uL — AB (ref 150–400)
RBC: 4.53 MIL/uL (ref 3.87–5.11)
RDW: 13.7 % (ref 11.5–15.5)
WBC: 9.4 10*3/uL (ref 4.0–10.5)

## 2014-03-15 LAB — TYPE AND SCREEN
ABO/RH(D): O POS
ANTIBODY SCREEN: NEGATIVE

## 2014-03-15 LAB — APTT: aPTT: 35 seconds (ref 24–37)

## 2014-03-15 MED ORDER — CHLORHEXIDINE GLUCONATE 4 % EX LIQD: 60.0000 mL | Freq: Once | CUTANEOUS | Status: DC

## 2014-03-15 NOTE — Pre-Procedure Instructions (Signed)
HELENA SARDO  03/15/2014   Your procedure is scheduled on:  March 22, 2014  Report to [facility] Admitting at [facility]  Call this number if you have problems the morning of surgery: [phone]   Remember:   Do not eat food or drink liquids after midnight.   Take these medicines the morning of surgery with A SIP OF WATER: NONE   STOP IBUPROFEN, NAPROXEN ONE WEEK PRIOR TO SURGERY   Do not wear jewelry, make-up or nail polish.  Do not wear lotions, powders, or perfumes. You may wear deodorant.  Do not shave 48 hours prior to surgery.   Do not bring valuables to the hospital.  [facility] is not responsible  for any belongings or valuables.               Contacts, dentures or bridgework may not be worn into surgery.  Leave suitcase in the car. After surgery it may be brought to your room.  For patients admitted to the hospital, discharge time is determined by your treatment team.                 Name and phone number of your driver:     Please read over the following fact sheets that you were given: Pain Booklet, Coughing and Deep Breathing, Blood Transfusion Information and Surgical Site Infection Prevention

## 2014-03-15 NOTE — Progress Notes (Signed)
Pt unable to void during PAT.  Will bring specimen DOS.  U/A and urine culture DOS

## 2014-03-21 MED ORDER — CEFAZOLIN SODIUM-DEXTROSE 2-3 GM-% IV SOLR
2.0000 g | INTRAVENOUS | Status: AC
  Administered 2014-03-22: 2 g via INTRAVENOUS
  Filled 2014-03-21: qty 50

## 2014-03-21 MED ORDER — LACTATED RINGERS IV SOLN: INTRAVENOUS | Status: DC

## 2014-03-21 NOTE — Progress Notes (Signed)
Pt. Notified of time change,to arrive at 1000,pt voices understanding.

## 2014-03-22 ENCOUNTER — Encounter (HOSPITAL_COMMUNITY): Payer: Medicare Other | Admitting: Anesthesiology

## 2014-03-22 ENCOUNTER — Inpatient Hospital Stay (HOSPITAL_COMMUNITY): Payer: Medicare Other

## 2014-03-22 ENCOUNTER — Inpatient Hospital Stay (HOSPITAL_COMMUNITY)
Admission: RE | Admit: 2014-03-22 | Discharge: 2014-03-23 | DRG: 470 | Disposition: A | Discharge status: Home Health | Payer: Medicare Other | Source: Ambulatory Visit | Attending: Orthopedic Surgery | Admitting: Orthopedic Surgery

## 2014-03-22 ENCOUNTER — Encounter (HOSPITAL_COMMUNITY)
Admission: RE | Disposition: A | Discharge status: Home Health | Payer: Self-pay | Source: Ambulatory Visit | Attending: Orthopedic Surgery

## 2014-03-22 ENCOUNTER — Inpatient Hospital Stay (HOSPITAL_COMMUNITY): Payer: Medicare Other | Admitting: Anesthesiology

## 2014-03-22 ENCOUNTER — Encounter (HOSPITAL_COMMUNITY): Payer: Self-pay | Admitting: Certified Registered Nurse Anesthetist

## 2014-03-22 DIAGNOSIS — Z96649 Presence of unspecified artificial hip joint: Secondary | ICD-10-CM

## 2014-03-22 DIAGNOSIS — Z9849 Cataract extraction status, unspecified eye: Secondary | ICD-10-CM

## 2014-03-22 DIAGNOSIS — M1610 Unilateral primary osteoarthritis, unspecified hip: Principal | ICD-10-CM | POA: Diagnosis present

## 2014-03-22 HISTORY — PX: TOTAL HIP ARTHROPLASTY: SHX124

## 2014-03-22 LAB — URINE MICROSCOPIC-ADD ON

## 2014-03-22 LAB — URINALYSIS, ROUTINE W REFLEX MICROSCOPIC
GLUCOSE, UA: 100 mg/dL — AB
Hgb urine dipstick: NEGATIVE
KETONES UR: NEGATIVE mg/dL
NITRITE: NEGATIVE
Protein, ur: 100 mg/dL — AB
Specific Gravity, Urine: 1.027 (ref 1.005–1.030)
Urobilinogen, UA: 0.2 mg/dL (ref 0.0–1.0)
pH: 8.5 — ABNORMAL HIGH (ref 5.0–8.0)

## 2014-03-22 SURGERY — ARTHROPLASTY, HIP, TOTAL, ANTERIOR APPROACH
Anesthesia: Monitor Anesthesia Care | Site: Hip | Laterality: Right

## 2014-03-22 MED ORDER — HYDROMORPHONE HCL PF 1 MG/ML IJ SOLN
0.5000 mg | INTRAMUSCULAR | Status: DC | PRN
  Administered 2014-03-22: 1 mg via INTRAVENOUS
  Filled 2014-03-22: qty 1

## 2014-03-22 MED ORDER — OXYCODONE HCL 5 MG/5ML PO SOLN: 5.0000 mg | Freq: Once | ORAL | Status: AC | PRN

## 2014-03-22 MED ORDER — OXYCODONE HCL 5 MG PO TABS
5.0000 mg | ORAL_TABLET | Freq: Once | ORAL | Status: AC | PRN
  Administered 2014-03-22: 5 mg via ORAL

## 2014-03-22 MED ORDER — LACTATED RINGERS IV SOLN
INTRAVENOUS | Status: DC
  Administered 2014-03-22 (×2): via INTRAVENOUS

## 2014-03-22 MED ORDER — OXYCODONE HCL 5 MG PO TABS
ORAL_TABLET | ORAL | Status: AC
  Administered 2014-03-22: 16:00:00
  Filled 2014-03-22: qty 1

## 2014-03-22 MED ORDER — ONDANSETRON HCL 4 MG PO TABS
4.0000 mg | ORAL_TABLET | Freq: Four times a day (QID) | ORAL | Status: DC | PRN
  Administered 2014-03-22: 4 mg via ORAL
  Filled 2014-03-22: qty 1

## 2014-03-22 MED ORDER — CEFAZOLIN SODIUM 1-5 GM-% IV SOLN
1.0000 g | Freq: Four times a day (QID) | INTRAVENOUS | Status: AC
  Administered 2014-03-22 – 2014-03-23 (×2): 1 g via INTRAVENOUS
  Filled 2014-03-22 (×2): qty 50

## 2014-03-22 MED ORDER — DEXTROSE 5 % IV SOLN
500.0000 mg | Freq: Four times a day (QID) | INTRAVENOUS | Status: DC | PRN
  Filled 2014-03-22: qty 5

## 2014-03-22 MED ORDER — PROPOFOL 10 MG/ML IV BOLUS
INTRAVENOUS | Status: DC | PRN
  Administered 2014-03-22 (×3): 30 mg via INTRAVENOUS

## 2014-03-22 MED ORDER — ASPIRIN EC 325 MG PO TBEC: 325.0000 mg | DELAYED_RELEASE_TABLET | Freq: Every day | ORAL | Status: DC

## 2014-03-22 MED ORDER — ACETAMINOPHEN 650 MG RE SUPP: 650.0000 mg | Freq: Four times a day (QID) | RECTAL | Status: DC | PRN

## 2014-03-22 MED ORDER — DIPHENHYDRAMINE HCL 12.5 MG/5ML PO ELIX: 12.5000 mg | ORAL_SOLUTION | ORAL | Status: DC | PRN

## 2014-03-22 MED ORDER — PHENOL 1.4 % MT LIQD: 1.0000 | OROMUCOSAL | Status: DC | PRN

## 2014-03-22 MED ORDER — ALUM & MAG HYDROXIDE-SIMETH 200-200-20 MG/5ML PO SUSP: 30.0000 mL | ORAL | Status: DC | PRN

## 2014-03-22 MED ORDER — OXYCODONE HCL 5 MG PO TABS
5.0000 mg | ORAL_TABLET | ORAL | Status: DC | PRN
  Administered 2014-03-22: 5 mg via ORAL
  Administered 2014-03-22: 10 mg via ORAL
  Administered 2014-03-23: 5 mg via ORAL
  Filled 2014-03-22: qty 2
  Filled 2014-03-22 (×2): qty 1

## 2014-03-22 MED ORDER — METHOCARBAMOL 500 MG PO TABS
ORAL_TABLET | ORAL | Status: AC
  Filled 2014-03-22: qty 1

## 2014-03-22 MED ORDER — 0.9 % SODIUM CHLORIDE (POUR BTL) OPTIME
TOPICAL | Status: DC | PRN
  Administered 2014-03-22: 1000 mL

## 2014-03-22 MED ORDER — METHOCARBAMOL 500 MG PO TABS
500.0000 mg | ORAL_TABLET | Freq: Four times a day (QID) | ORAL | Status: DC | PRN
  Administered 2014-03-22: 500 mg via ORAL
  Filled 2014-03-22: qty 1

## 2014-03-22 MED ORDER — FENTANYL CITRATE 0.05 MG/ML IJ SOLN
25.0000 ug | INTRAMUSCULAR | Status: DC | PRN
  Administered 2014-03-22: 50 ug via INTRAVENOUS

## 2014-03-22 MED ORDER — BUPIVACAINE HCL (PF) 0.5 % IJ SOLN
INTRAMUSCULAR | Status: DC | PRN
  Administered 2014-03-22: 3 mL

## 2014-03-22 MED ORDER — BISACODYL 5 MG PO TBEC: 5.0000 mg | DELAYED_RELEASE_TABLET | Freq: Every day | ORAL | Status: DC | PRN

## 2014-03-22 MED ORDER — FENTANYL CITRATE 0.05 MG/ML IJ SOLN
INTRAMUSCULAR | Status: DC | PRN
  Administered 2014-03-22: 100 ug via INTRAVENOUS
  Administered 2014-03-22: 50 ug via INTRAVENOUS

## 2014-03-22 MED ORDER — ONDANSETRON HCL 4 MG PO TABS: 4.0000 mg | ORAL_TABLET | Freq: Three times a day (TID) | ORAL | Status: DC | PRN

## 2014-03-22 MED ORDER — PROPOFOL 10 MG/ML IV BOLUS
INTRAVENOUS | Status: AC
  Filled 2014-03-22: qty 20

## 2014-03-22 MED ORDER — SODIUM CHLORIDE 0.9 % IR SOLN
Status: DC | PRN
  Administered 2014-03-22: 1

## 2014-03-22 MED ORDER — DOCUSATE SODIUM 100 MG PO CAPS
100.0000 mg | ORAL_CAPSULE | Freq: Two times a day (BID) | ORAL | Status: DC
  Administered 2014-03-23: 100 mg via ORAL
  Filled 2014-03-22 (×3): qty 1

## 2014-03-22 MED ORDER — POTASSIUM CHLORIDE IN NACL 20-0.9 MEQ/L-% IV SOLN
INTRAVENOUS | Status: DC
  Administered 2014-03-23: 02:00:00 via INTRAVENOUS
  Filled 2014-03-22 (×5): qty 1000

## 2014-03-22 MED ORDER — METOCLOPRAMIDE HCL 5 MG PO TABS
5.0000 mg | ORAL_TABLET | Freq: Three times a day (TID) | ORAL | Status: DC | PRN
  Filled 2014-03-22: qty 2

## 2014-03-22 MED ORDER — BUPIVACAINE LIPOSOME 1.3 % IJ SUSP
20.0000 mL | Freq: Once | INTRAMUSCULAR | Status: DC
  Filled 2014-03-22: qty 20

## 2014-03-22 MED ORDER — METOCLOPRAMIDE HCL 5 MG/ML IJ SOLN
5.0000 mg | Freq: Three times a day (TID) | INTRAMUSCULAR | Status: DC | PRN
Start: 2014-03-22 — End: 2014-03-23

## 2014-03-22 MED ORDER — MENTHOL 3 MG MT LOZG: 1.0000 | LOZENGE | OROMUCOSAL | Status: DC | PRN

## 2014-03-22 MED ORDER — ONDANSETRON HCL 4 MG/2ML IJ SOLN: 4.0000 mg | Freq: Once | INTRAMUSCULAR | Status: DC | PRN

## 2014-03-22 MED ORDER — CELECOXIB 200 MG PO CAPS
200.0000 mg | ORAL_CAPSULE | Freq: Two times a day (BID) | ORAL | Status: DC
  Administered 2014-03-22 – 2014-03-23 (×2): 200 mg via ORAL
  Filled 2014-03-22 (×3): qty 1

## 2014-03-22 MED ORDER — LIDOCAINE HCL (CARDIAC) 20 MG/ML IV SOLN
INTRAVENOUS | Status: AC
  Filled 2014-03-22: qty 5

## 2014-03-22 MED ORDER — OXYCODONE-ACETAMINOPHEN 5-325 MG PO TABS: 1.0000 | ORAL_TABLET | ORAL | Status: DC | PRN

## 2014-03-22 MED ORDER — ACETAMINOPHEN 160 MG/5ML PO SOLN
325.0000 mg | ORAL | Status: DC | PRN
  Filled 2014-03-22: qty 20.3

## 2014-03-22 MED ORDER — PROPOFOL INFUSION 10 MG/ML OPTIME
INTRAVENOUS | Status: DC | PRN
  Administered 2014-03-22: 50 ug/kg/min via INTRAVENOUS

## 2014-03-22 MED ORDER — BUPIVACAINE LIPOSOME 1.3 % IJ SUSP
INTRAMUSCULAR | Status: DC | PRN
  Administered 2014-03-22: 20 mL

## 2014-03-22 MED ORDER — METHOCARBAMOL 500 MG PO TABS: 500.0000 mg | ORAL_TABLET | Freq: Four times a day (QID) | ORAL | Status: DC

## 2014-03-22 MED ORDER — ACETAMINOPHEN 325 MG PO TABS: 325.0000 mg | ORAL_TABLET | ORAL | Status: DC | PRN

## 2014-03-22 MED ORDER — FENTANYL CITRATE 0.05 MG/ML IJ SOLN
INTRAMUSCULAR | Status: AC
  Filled 2014-03-22: qty 5

## 2014-03-22 MED ORDER — EPHEDRINE SULFATE 50 MG/ML IJ SOLN
INTRAMUSCULAR | Status: DC | PRN
  Administered 2014-03-22 (×2): 5 mg via INTRAVENOUS

## 2014-03-22 MED ORDER — ONDANSETRON HCL 4 MG/2ML IJ SOLN: 4.0000 mg | Freq: Four times a day (QID) | INTRAMUSCULAR | Status: DC | PRN

## 2014-03-22 MED ORDER — TRANEXAMIC ACID 100 MG/ML IV SOLN
1000.0000 mg | INTRAVENOUS | Status: AC
  Administered 2014-03-22: 1000 mg via INTRAVENOUS
  Filled 2014-03-22: qty 10

## 2014-03-22 MED ORDER — ASPIRIN EC 325 MG PO TBEC
325.0000 mg | DELAYED_RELEASE_TABLET | Freq: Every day | ORAL | Status: DC
  Administered 2014-03-23: 325 mg via ORAL
  Filled 2014-03-22 (×2): qty 1

## 2014-03-22 MED ORDER — MIDAZOLAM HCL 2 MG/2ML IJ SOLN
INTRAMUSCULAR | Status: AC
  Filled 2014-03-22: qty 2

## 2014-03-22 MED ORDER — MIDAZOLAM HCL 5 MG/5ML IJ SOLN
INTRAMUSCULAR | Status: DC | PRN
  Administered 2014-03-22 (×2): 1 mg via INTRAVENOUS

## 2014-03-22 MED ORDER — FENTANYL CITRATE 0.05 MG/ML IJ SOLN
INTRAMUSCULAR | Status: AC
  Administered 2014-03-22: 16:00:00
  Filled 2014-03-22: qty 2

## 2014-03-22 MED ORDER — ACETAMINOPHEN 325 MG PO TABS: 650.0000 mg | ORAL_TABLET | Freq: Four times a day (QID) | ORAL | Status: DC | PRN

## 2014-03-22 SURGICAL SUPPLY — 54 items
BENZOIN TINCTURE PRP APPL 2/3 (GAUZE/BANDAGES/DRESSINGS) ×3 IMPLANT
BLADE SAW SGTL 18X1.27X75 (BLADE) ×2 IMPLANT
BLADE SAW SGTL 18X1.27X75MM (BLADE) ×1
BLADE SURG ROTATE 9660 (MISCELLANEOUS) IMPLANT
CAPT HIP PF COP ×3 IMPLANT
CLOSURE STERI-STRIP 1/2X4 (GAUZE/BANDAGES/DRESSINGS) ×1
CLSR STERI-STRIP ANTIMIC 1/2X4 (GAUZE/BANDAGES/DRESSINGS) ×2 IMPLANT
COVER SURGICAL LIGHT HANDLE (MISCELLANEOUS) ×3 IMPLANT
DRAPE IMP U-DRAPE 54X76 (DRAPES) ×6 IMPLANT
DRAPE INCISE IOBAN 66X45 STRL (DRAPES) ×3 IMPLANT
DRAPE ORTHO SPLIT 77X108 STRL (DRAPES) ×4
DRAPE PROXIMA HALF (DRAPES) ×3 IMPLANT
DRAPE SURG 17X23 STRL (DRAPES) ×3 IMPLANT
DRAPE SURG ORHT 6 SPLT 77X108 (DRAPES) ×2 IMPLANT
DRAPE U-SHAPE 47X51 STRL (DRAPES) ×3 IMPLANT
DRSG AQUACEL AG ADV 3.5X10 (GAUZE/BANDAGES/DRESSINGS) ×3 IMPLANT
DURAPREP 26ML APPLICATOR (WOUND CARE) ×3 IMPLANT
ELECT BLADE 4.0 EZ CLEAN MEGAD (MISCELLANEOUS) ×6
ELECT CAUTERY BLADE 6.4 (BLADE) ×3 IMPLANT
ELECT REM PT RETURN 9FT ADLT (ELECTROSURGICAL) ×3
ELECTRODE BLDE 4.0 EZ CLN MEGD (MISCELLANEOUS) ×2 IMPLANT
ELECTRODE REM PT RTRN 9FT ADLT (ELECTROSURGICAL) ×1 IMPLANT
FACESHIELD WRAPAROUND (MASK) ×6 IMPLANT
GLOVE BIOGEL PI IND STRL 7.0 (GLOVE) IMPLANT
GLOVE BIOGEL PI INDICATOR 7.0 (GLOVE)
GLOVE ECLIPSE 6.5 STRL STRAW (GLOVE) IMPLANT
GLOVE ORTHO TXT STRL SZ7.5 (GLOVE) ×3 IMPLANT
GOWN STRL REUS W/ TWL LRG LVL3 (GOWN DISPOSABLE) ×3 IMPLANT
GOWN STRL REUS W/ TWL XL LVL3 (GOWN DISPOSABLE) ×1 IMPLANT
GOWN STRL REUS W/TWL LRG LVL3 (GOWN DISPOSABLE) ×6
GOWN STRL REUS W/TWL XL LVL3 (GOWN DISPOSABLE) ×2
HANDPIECE INTERPULSE COAX TIP (DISPOSABLE)
KIT BASIN OR (CUSTOM PROCEDURE TRAY) ×3 IMPLANT
KIT ROOM TURNOVER OR (KITS) ×3 IMPLANT
MANIFOLD NEPTUNE II (INSTRUMENTS) ×3 IMPLANT
NDL SAFETY ECLIPSE 18X1.5 (NEEDLE) ×1 IMPLANT
NEEDLE HYPO 18GX1.5 SHARP (NEEDLE) ×2
NS IRRIG 1000ML POUR BTL (IV SOLUTION) ×3 IMPLANT
PACK TOTAL JOINT (CUSTOM PROCEDURE TRAY) ×3 IMPLANT
PAD ARMBOARD 7.5X6 YLW CONV (MISCELLANEOUS) ×6 IMPLANT
SET HNDPC FAN SPRY TIP SCT (DISPOSABLE) IMPLANT
SUCTION FRAZIER TIP 10 FR DISP (SUCTIONS) ×3 IMPLANT
SUT ETHIBOND NAB CT1 #1 30IN (SUTURE) IMPLANT
SUT MNCRL AB 4-0 PS2 18 (SUTURE) ×3 IMPLANT
SUT MON AB 2-0 CT1 27 (SUTURE) ×3 IMPLANT
SUT VIC AB 0 CT1 27 (SUTURE) ×2
SUT VIC AB 0 CT1 27XBRD ANBCTR (SUTURE) ×1 IMPLANT
SUT VIC AB 1 CT1 27 (SUTURE) ×2
SUT VIC AB 1 CT1 27XBRD ANBCTR (SUTURE) ×1 IMPLANT
SYR 50ML LL SCALE MARK (SYRINGE) ×3 IMPLANT
TOWEL OR 17X24 6PK STRL BLUE (TOWEL DISPOSABLE) ×3 IMPLANT
TOWEL OR 17X26 10 PK STRL BLUE (TOWEL DISPOSABLE) ×3 IMPLANT
TRAY FOLEY CATH 14FR (SET/KITS/TRAYS/PACK) ×3 IMPLANT
WATER STERILE IRR 1000ML POUR (IV SOLUTION) ×6 IMPLANT

## 2014-03-22 NOTE — Anesthesia Preprocedure Evaluation (Signed)
Anesthesia Evaluation  Patient identified by MRN, date of birth, ID band Patient awake    Reviewed: Allergy & Precautions, H&P , NPO status , Patient's Chart, lab work & pertinent test results  History of Anesthesia Complications Negative for: history of anesthetic complications  Airway Mallampati: III TM Distance: >3 FB Neck ROM: Full    Dental  (+) Upper Dentures, Lower Dentures   Pulmonary neg pulmonary ROS,  breath sounds clear to auscultation        Cardiovascular negative cardio ROS  Rhythm:Regular     Neuro/Psych negative neurological ROS  negative psych ROS   GI/Hepatic negative GI ROS, Neg liver ROS,   Endo/Other  negative endocrine ROS  Renal/GU negative Renal ROS     Musculoskeletal  (+) Arthritis -, Osteoarthritis,    Abdominal   Peds  Hematology negative hematology ROS (+)   Anesthesia Other Findings   Reproductive/Obstetrics                           Anesthesia Physical Anesthesia Plan  ASA: II  Anesthesia Plan: Spinal and MAC   Post-op Pain Management:    Induction:   Airway Management Planned: Natural Airway and Nasal Cannula  Additional Equipment: None  Intra-op Plan:   Post-operative Plan:   Informed Consent: I have reviewed the patients History and Physical, chart, labs and discussed the procedure including the risks, benefits and alternatives for the proposed anesthesia with the patient or authorized representative who has indicated his/her understanding and acceptance.     Plan Discussed with: CRNA and Surgeon  Anesthesia Plan Comments:         Anesthesia Quick Evaluation

## 2014-03-22 NOTE — Interval H&P Note (Signed)
History and Physical Interval Note:  03/22/2014 8:26 AM  Courtney Proctor  has presented today for surgery, with the diagnosis of djd right hip  The various methods of treatment have been discussed with the patient and family. After consideration of risks, benefits and other options for treatment, the patient has consented to  Procedure(s): TOTAL HIP ARTHROPLASTY ANTERIOR APPROACH (Right) as a surgical intervention .  The patient's history has been reviewed, patient examined, no change in status, stable for surgery.  I have reviewed the patient's chart and labs.  Questions were answered to the patient's satisfaction.     Tkeya Stencil F

## 2014-03-22 NOTE — Progress Notes (Signed)
Utilization review completed.  

## 2014-03-22 NOTE — Anesthesia Procedure Notes (Signed)
Spinal  Patient location during procedure: OB Start time: 03/22/2014 12:11 PM End time: 03/22/2014 12:17 PM Staffing Anesthesiologist: Doniven Vanpatten, CHRIS Preanesthetic Checklist Completed: patient identified, surgical consent, pre-op evaluation, timeout performed, IV checked, risks and benefits discussed and monitors and equipment checked Spinal Block Patient position: sitting Prep: site prepped and draped and DuraPrep Patient monitoring: heart rate, cardiac monitor, continuous pulse ox and blood pressure Approach: midline Location: L3-4 Injection technique: single-shot Needle Needle type: Pencan  Needle gauge: 24 G Needle length: 10 cm Assessment Sensory level: T10

## 2014-03-22 NOTE — H&P (View-Only) (Signed)
TOTAL HIP ADMISSION H&P  Patient is admitted for right total hip arthroplasty.  Subjective:  Chief Complaint: right hip pain  HPI: Courtney Proctor, 78 y.o. female, has a history of pain and functional disability in the right hip(s) due to trauma and patient has failed non-surgical conservative treatments for greater than 12 weeks to include NSAID's and/or analgesics, use of assistive devices and activity modification.  Onset of symptoms was gradual starting 1 years ago with gradually worsening course since that time.The patient noted no past surgery on the right hip(s).  Patient currently rates pain in the right hip at 10 out of 10 with activity. Patient has night pain, worsening of pain with activity and weight bearing, trendelenberg gait, pain that interfers with activities of daily living, pain with passive range of motion and joint swelling. Patient has evidence of subchondral sclerosis and joint space narrowing by imaging studies. This condition presents safety issues increasing the risk of falls.  There is no current active infection.  There are no active problems to display for this patient.  Past Medical History  Diagnosis Date  . 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 Arthritis     hip  . Joint pain     left hip  . Hemorrhoids   . Nocturia   . Early cataracts, bilateral     Past Surgical History  Procedure Laterality Date  . Total hip arthroplasty  11/12/2011    Procedure: TOTAL HIP ARTHROPLASTY;  Surgeon: Ninetta Lights, MD;  Location: [facility];  Service: Orthopedics;  Laterality: Left;  120 MINUTES FOR THIS SURGERY  . Cataract extraction w/phaco Right 05/09/2013    Procedure: CATARACT EXTRACTION PHACO AND INTRAOCULAR LENS PLACEMENT (IOC);  Surgeon: Tonny Branch, MD;  Location: [facility];  Service: Ophthalmology;  Laterality: Right;  CDE 28.35  . Cataract extraction w/phaco Left 05/19/2013    Procedure: CATARACT EXTRACTION PHACO AND INTRAOCULAR LENS  PLACEMENT (IOC);  Surgeon: Tonny Branch, MD;  Location: [facility];  Service: Ophthalmology;  Laterality: Left;  CDE:28.11     (Not in a hospital admission) No Known Allergies  History  Substance Use Topics  . Smoking status: Never Smoker   . Smokeless tobacco: Not on file  . Alcohol Use: No    Family History  Problem Relation Age of Onset  . Anesthesia problems Neg Hx   . Hypotension Neg Hx   . Malignant hyperthermia Neg Hx   . Pseudochol deficiency Neg Hx      Review of Systems  Constitutional: Negative.   HENT: Negative.   Eyes: Negative.   Respiratory: Negative.   Cardiovascular: Negative.   Gastrointestinal: Negative.   Genitourinary: Negative.   Musculoskeletal: Positive for joint pain.  Skin: Negative.   Neurological: Negative.   Endo/Heme/Allergies: Negative.   Psychiatric/Behavioral: Negative.     Objective:  Physical Exam  Constitutional: She is oriented to person, place, and time. She appears well-developed and well-nourished.  HENT:  Head: Normocephalic and atraumatic.  Eyes: EOM are normal. Pupils are equal, round, and reactive to light.  Neck: Normal range of motion. Neck supple.  Cardiovascular: Normal rate and regular rhythm.  Exam reveals no gallop and no friction rub.   Murmur heard. Respiratory: Effort normal and breath sounds normal.  GI: Soft. Bowel sounds are normal.  Musculoskeletal:  General exam is outlined and included in the chart.  Specifically, markedly antalgic Trendelenburg gait on the right.  She uses a cane in  her left hand.  The left hip negative log roll, great motion, good strength.  On the right, virtually no motion with rotation.  Very little abduction.  Neurovascularly intact distally.   Neurological: She is alert and oriented to person, place, and time.  Skin: Skin is warm and dry.  Psychiatric: She has a normal mood and affect. Her behavior is normal. Judgment and thought content normal.    Vital signs in last 24  hours: @VSRANGES @  Labs:   Estimated body mass index is 31.38 kg/(m^2) as calculated from the following:   Height as of 05/19/13: 5\' 1"  (1.549 m).   Weight as of 05/02/13: 75.297 kg (166 lb).   Imaging Review Plain radiographs demonstrate severe degenerative joint disease of the right hip(s). The bone quality appears to be fair for age and reported activity level.  Assessment/Plan:  End stage arthritis, right hip(s)  The patient history, physical examination, clinical judgement of the provider and imaging studies are consistent with end stage degenerative joint disease of the right hip(s) and total hip arthroplasty is deemed medically necessary. The treatment options including medical management, injection therapy, arthroscopy and arthroplasty were discussed at length. The risks and benefits of total hip arthroplasty were presented and reviewed. The risks due to aseptic loosening, infection, stiffness, dislocation/subluxation,  thromboembolic complications and other imponderables were discussed.  The patient acknowledged the explanation, agreed to proceed with the plan and consent was signed. Patient is being admitted for inpatient treatment for surgery, pain control, PT, OT, prophylactic antibiotics, VTE prophylaxis, progressive ambulation and ADL's and discharge planning.The patient is planning to be discharged home with home health services

## 2014-03-22 NOTE — Anesthesia Postprocedure Evaluation (Signed)
  Anesthesia Post-op Note  Patient: Courtney Proctor  Procedure(s) Performed: Procedure(s): TOTAL HIP ARTHROPLASTY ANTERIOR APPROACH (Right)  Patient Location: PACU  Anesthesia Type:General  Level of Consciousness: awake  Airway and Oxygen Therapy: Patient Spontanous Breathing  Post-op Pain: mild  Post-op Assessment: Post-op Vital signs reviewed  Post-op Vital Signs: Reviewed  Last Vitals:  Filed Vitals:   03/22/14 1540  BP: 156/70  Pulse: 80  Temp:   Resp: 15    Complications: No apparent anesthesia complications

## 2014-03-22 NOTE — Discharge Instructions (Signed)
Total Hip Replacement Care After Refer to this sheet in the next few weeks. These instructions provide you with information on caring for yourself after your procedure. Your caregiver also may give you specific instructions. Your treatment has been planned according to the most current medical practices, but problems sometimes occur. Call your caregiver if you have any problems or questions after your procedure. HOME CARE INSTRUCTIONS   Weight bearing as tolerated. Take Aspirin 1 tab a day for the next 30 days to prevent blood clots.  Change dressing daily starting on Saturday.  May shower on Monday, but do not soak incision.  May apply ice for up to 20 minutes at a time for pain and swelling.  Follow up appointment in our office in two weeks.   Your caregiver will give you specific precautions for certain types of movement. Additional instructions include:  Take over-the-counter or prescription medicines for pain, discomfort, or fever only as directed by your caregiver.  Take quick showers (3 5 min) rather than bathe until your caregiver tells you that you can take baths again.  Avoid lifting until your caregiver instructs you otherwise.  Use a raised toilet seat and avoid sitting in low chairs as instructed by your caregiver.  Use crutches or a walker as instructed by your caregiver. SEEK MEDICAL CARE IF:  You have difficulty breathing.  Your wound is red, swollen, or has become increasingly painful.  You have pus draining from your wound.  You have a bad smell coming from your wound.  You have persistent bleeding from your wound.  Your wound breaks open after sutures (stitches) or staples have been removed. SEEK IMMEDIATE MEDICAL CARE IF:   You have a fever.  You have a rash.  You have pain or swelling in your calf or thigh.  You have shortness of breath or chest pain. MAKE SURE YOU:  Understand these instructions.  Will watch your condition.  Will get help right  away if you are not doing well or get worse. Document Released: 04/18/2005 Document Revised: 03/30/2012 Document Reviewed: 11/16/2011 [facility] Patient Information 2014 [place].

## 2014-03-22 NOTE — Discharge Summary (Addendum)
Patient ID: Courtney Proctor MRN: [id number] DOB/AGE: 1936/04/06 78 y.o.  Admit date: 03/22/2014 Discharge date: 03/23/2014  Admission Diagnoses:  Active Problems:   Primary localized osteoarthrosis, pelvic region and thigh   Discharge Diagnoses:  Same  Past Medical History  Diagnosis Date  . Heart murmur     rhuematic fever at age 35;slight murmur from this  . Arthritis     hip  . Joint pain     left hip  . Hemorrhoids   . Nocturia   . Early cataracts, bilateral     Surgeries: Procedure(s): TOTAL HIP ARTHROPLASTY ANTERIOR APPROACH on 03/22/2014   Consultants:    Discharged Condition: Improved  Hospital Course: Courtney Proctor is an 78 y.o. female who was admitted 03/22/2014 for operative treatment of<principal problem not specified>. Patient has severe unremitting pain that affects sleep, daily activities, and work/hobbies. After pre-op clearance the patient was taken to the operating room on 03/22/2014 and underwent  Procedure(s): TOTAL HIP ARTHROPLASTY ANTERIOR APPROACH.    Patient was given perioperative antibiotics:     Anti-infectives   Start     Dose/Rate Route Frequency Ordered Stop   03/22/14 1800  ceFAZolin (ANCEF) IVPB 1 g/50 mL premix     1 g 100 mL/hr over 30 Minutes Intravenous Every 6 hours 03/22/14 1609 03/23/14 0051   03/22/14 0600  ceFAZolin (ANCEF) IVPB 2 g/50 mL premix     2 g 100 mL/hr over 30 Minutes Intravenous On call to O.R. 03/21/14 1410 03/22/14 1217       Patient was given sequential compression devices, early ambulation, and chemoprophylaxis to prevent DVT.  Patient benefited maximally from hospital stay and there were no complications.    Recent vital signs:  Patient Vitals for the past 24 hrs:  BP Temp Temp src Pulse Resp SpO2  03/23/14 0354 - - - - 18 97 %  03/23/14 0000 - - - - 18 98 %  03/22/14 2020 166/68 mmHg 98.2 F (36.8 C) - 95 18 99 %  03/22/14 1800 154/59 mmHg 98.1 F (36.7 C) Oral 90 20 99 %  03/22/14 1700 185/65 mmHg  97.5 F (36.4 C) Oral 88 20 99 %  03/22/14 1600 152/72 mmHg 97.5 F (36.4 C) Oral 80 18 97 %  03/22/14 1540 156/70 mmHg 97.7 F (36.5 C) - 80 15 100 %  03/22/14 1530 134/67 mmHg - - 74 18 100 %  03/22/14 1515 141/49 mmHg - - 66 16 100 %  03/22/14 1500 150/51 mmHg - - 60 19 100 %  03/22/14 1445 130/51 mmHg - - 63 17 96 %  03/22/14 1435 131/60 mmHg 97.8 F (36.6 C) - 66 17 100 %  03/22/14 1005 195/65 mmHg 98 F (36.7 C) Oral 79 16 98 %     Recent laboratory studies: No results found for this basename: WBC, HGB, HCT, PLT, NA, K, CL, CO2, BUN, CREATININE, GLUCOSE, PT, INR, CALCIUM, 2,  in the last 72 hours   Discharge Medications:     Medication List    STOP taking these medications       ibuprofen 200 MG tablet  Commonly known as:  ADVIL,MOTRIN     naproxen sodium 220 MG tablet  Commonly known as:  ANAPROX      TAKE these medications       aspirin EC 325 MG tablet  Take 1 tablet (325 mg total) by mouth daily.     bisacodyl 5 MG EC tablet  Commonly known as:  DULCOLAX  Take 1 tablet (5 mg total) by mouth daily as needed for moderate constipation.     methocarbamol 500 MG tablet  Commonly known as:  ROBAXIN  Take 1 tablet (500 mg total) by mouth 4 (four) times daily.     ondansetron 4 MG tablet  Commonly known as:  ZOFRAN  Take 1 tablet (4 mg total) by mouth every 8 (eight) hours as needed for nausea or vomiting.     oxyCODONE-acetaminophen 5-325 MG per tablet  Commonly known as:  ROXICET  Take 1-2 tablets by mouth every 4 (four) hours as needed.        Diagnostic Studies: Dg Chest 2 View  03/15/2014   CLINICAL DATA:  Heart murmur.  EXAM: CHEST  2 VIEW  COMPARISON:  November 04, 2011.  FINDINGS: The heart size and mediastinal contours are within normal limits. Stable small hiatal hernia. Both lungs are clear. The visualized skeletal structures are unremarkable.  IMPRESSION: No acute cardiopulmonary abnormality seen.   Electronically Signed   By: Sabino Dick M.D.    On: 03/15/2014 13:55    Disposition: 06-Home-Health Care Svc  Discharge Instructions   Call MD / Call 911    Complete by:  As directed   If you experience chest pain or shortness of breath, CALL 911 and be transported to the hospital emergency room.  If you develope a fever above 101 F, pus (white drainage) or increased drainage or redness at the wound, or calf pain, call your surgeon's office.     Change dressing    Complete by:  As directed   Change the dressing daily with sterile 4 x 4 inch gauze dressing and paper tape.  You may clean the incision with alcohol prior to redressing     Constipation Prevention    Complete by:  As directed   Drink plenty of fluids.  Prune juice may be helpful.  You may use a stool softener, such as Colace (over the counter) 100 mg twice a day.  Use MiraLax (over the counter) for constipation as needed.     Diet - low sodium heart healthy    Complete by:  As directed      Discharge instructions    Complete by:  As directed   Weight bearing as tolerated.  Take Aspirin 1 tab a day for the next 30 days to prevent blood clots.  Change dressing daily starting on Saturday.  May shower on Monday, but do not soak incision.  May apply ice for up to 20 minutes at a time for pain and swelling.  Follow up appointment in our office in two weeks.     Do not put a pillow under the knee. Place it under the heel.    Complete by:  As directed   Place gray foam under operative heel when in bed or in a chair to work on extension     Follow the hip precautions as taught in Physical Therapy    Complete by:  As directed   Posterior total hip precautions.  Weight bearing as tolerated     Increase activity slowly as tolerated    Complete by:  As directed      TED hose    Complete by:  As directed   Use stockings (TED hose) for 2-3 weeks on both leg(s).  You may remove them at night for sleeping.           Follow-up Information   Follow up with  Ninetta Lights, MD. Schedule  an appointment as soon as possible for a visit in 2 weeks.   Specialty:  Orthopedic Surgery   Contact information:   [address] [place] [address] [phone]        Signed: Larae Grooms 03/23/2014, 6:10 AM

## 2014-03-22 NOTE — Transfer of Care (Signed)
Immediate Anesthesia Transfer of Care Note  Patient: Courtney Proctor  Procedure(s) Performed: Procedure(s): TOTAL HIP ARTHROPLASTY ANTERIOR APPROACH (Right)  Patient Location: PACU  Anesthesia Type:MAC and Spinal  Level of Consciousness: awake, alert , oriented and patient cooperative  Airway & Oxygen Therapy: Patient Spontanous Breathing and Patient connected to nasal cannula oxygen  Post-op Assessment: Report given to PACU RN and Post -op Vital signs reviewed and stable  Post vital signs: Reviewed and stable  Complications: No apparent anesthesia complications

## 2014-03-23 ENCOUNTER — Encounter (HOSPITAL_COMMUNITY): Payer: Self-pay | Admitting: *Deleted

## 2014-03-23 LAB — BASIC METABOLIC PANEL
BUN: 8 mg/dL (ref 6–23)
CHLORIDE: 98 meq/L (ref 96–112)
CO2: 24 meq/L (ref 19–32)
Calcium: 8.4 mg/dL (ref 8.4–10.5)
Creatinine, Ser: 0.66 mg/dL (ref 0.50–1.10)
GFR calc Af Amer: >90 mL/min (ref >90)
GFR calc non Af Amer: 84 mL/min — ABNORMAL LOW (ref >90)
GLUCOSE: 141 mg/dL — AB (ref 70–99)
POTASSIUM: 3.8 meq/L (ref 3.7–5.3)
SODIUM: 135 meq/L — AB (ref 137–147)

## 2014-03-23 LAB — URINE CULTURE

## 2014-03-23 LAB — CBC
HCT: 32.3 % — ABNORMAL LOW (ref 36.0–46.0)
HEMOGLOBIN: 10.5 g/dL — AB (ref 12.0–15.0)
MCH: 27.8 pg (ref 26.0–34.0)
MCHC: 32.5 g/dL (ref 30.0–36.0)
MCV: 85.4 fL (ref 78.0–100.0)
PLATELETS: 298 10*3/uL (ref 150–400)
RBC: 3.78 MIL/uL — AB (ref 3.87–5.11)
RDW: 13.7 % (ref 11.5–15.5)
WBC: 12.1 10*3/uL — AB (ref 4.0–10.5)

## 2014-03-23 NOTE — Op Note (Signed)
Courtney Proctor, Courtney Proctor              ACCOUNT NO.:  [id number]  MEDICAL RECORD NO.:  [id number]  LOCATION:  5N20C                        FACILITY:  [facility]  PHYSICIAN:  Ninetta Lights, M.D. DATE OF BIRTH:  02-01-1936  DATE OF PROCEDURE:  03/22/2014 DATE OF DISCHARGE:  03/23/2014                              OPERATIVE REPORT   PREOPERATIVE DIAGNOSIS:  Right hip end-stage degenerative arthritis, extensive erosive changes with a 30 degree flexion contracture.  POSTOPERATIVE DIAGNOSIS:  Right hip end-stage degenerative arthritis, extensive erosive changes with a 30 degree flexion contracture.  PROCEDURE:  Anterior approach right total hip replacement utilizing Stryker prosthesis.  a press-fit 50-mm acetabular component screw fixation x2.  a 36-mm internal diameter liner.  a press-fit #4 accolade stem, 127 degree neck angle with a -2.5 x 36 Biolox head.  SURGEON:  Ninetta Lights, MD  ASSISTANT:  Doran Stabler, PA present throughout the entire case, necessary for timely completion of procedure.  ANESTHESIA:  General.  BLOOD LOSS:  200 mL.  BLOOD GIVEN:  None.  SPECIMENS:  None.  CULTURES:  None.  COMPLICATIONS:  None.  DRESSINGS:  Soft compressive.  DESCRIPTION OF PROCEDURE:  The patient was brought to the operating room, placed on the operating table in supine position.  After adequate anesthesia had been obtained, she was appropriately positioned, prepped and draped for anterior approach.  A longitudinal incision from the anterior superior iliac spine extending distal and posterior.  Possibly making this difficult.  Skin and subcutaneous tissue divided.  Fascia over the tensor opened.  Muscle retracted laterally.  I then used sharp and blunt dissection to expose the front of the hip.  I entered the anterior capsule, deep fascia, excised and exposed the hip.  Femoral neck was cut just below the head and then a napkin ring below that 1 fingerbreadth above the lesser  trochanter.  All fragments removed. Acetabulum exposed.  I brought this as medial as inferior as possible. I was very concerned that how far I could go because of her age and bone quality.  I was able to fit this with a 50-mm component.  As far medial as I liked it still well captured and fixed.  40 degrees of abduction. Hammered in place.  I augmented with 2 screws through the cup and then a 36-mm internal diameter liner.  The femur was then freed for access.  I used a cookie cutter and then broaches to bring this up for appropriate sizing for a #4 component.  After appropriate trials, the #4 component was seated.  With the 36 Biolox head -2.5 hip was reduced.  Good stability, equal leg lengths.  Wound irrigated.  Soft tissues injected with Exparel.  Fascia over the tensor closed with Vicryl.  Subcutaneous and subcuticular closure of the wound.  Margins were injected with Marcaine.  Sterile compressive dressing applied.  Anesthesia reversed. Brought to the recovery room.  Tolerated the surgery well.  No complications.     Ninetta Lights, M.D.     DFM/MEDQ  D:  03/23/2014  T:  03/23/2014  Job:  [id number]

## 2014-03-23 NOTE — Evaluation (Signed)
I have read and agree with this note.   Time in/out: 11:48-12:18 Total time:30 minutes ([place] and [place])  [place], OTR/L [phone]

## 2014-03-23 NOTE — Evaluation (Signed)
Physical Therapy Evaluation Patient Details Name: Courtney Proctor MRN: [id number] DOB: November 20, 1935 Today's Date: 03/23/2014   History of Present Illness  78 y.o. female admitted to [facility] on 03/22/14 s/p elective anterior THA.  She has significant PMHx of L posterior THA (2013).  Clinical Impression  Pt is POD #1 s/p elective R anterior THA.  She is very motivated and wants to go home today, so all mobility and exercise education completed so that she can go home with her son and family's 24/7 help.  She is experienced at joint replacement and knows what to expect.  She reports she feels better than when she came in for surgery.  PT to follow acutely.     Follow Up Recommendations Home health PT;Supervision for mobility/OOB    Equipment Recommendations  None recommended by PT    Recommendations for Other Services   NA    Precautions / Restrictions Precautions Precautions:  (conflicting orders in chart, no op note) Precaution Comments: post precautions ordered, but PT orders state anterior approach.  Called PA to confirm which to teach. No return call yet.  Restrictions RLE Weight Bearing: Weight bearing as tolerated      Mobility           Transfers Overall transfer level: Needs assistance Equipment used: Rolling walker (2 wheeled) Transfers: Sit to/from Stand Sit to Stand: Supervision         General transfer comment: supervision for safety due to stiffness from sitting up for a long period without moving.  Ambulation/Gait Ambulation/Gait assistance: Min guard Ambulation Distance (Feet): 100 Feet Assistive device: Rolling walker (2 wheeled) Gait Pattern/deviations: Step-through pattern;Antalgic;Trunk flexed Gait velocity: decreased Gait velocity interpretation: Below normal speed for age/gender General Gait Details: verbal cues for safe use of RW and upright trunk posture during gait.   Stairs Stairs:  (pt not going down into den until home therapist works on it)               Insurance underwriter Overall balance assessment: Needs assistance Sitting-balance support: Feet supported Sitting balance-Leahy Scale: Good     Standing balance support: Bilateral upper extremity supported Standing balance-Leahy Scale: Poor                               Pertinent Vitals/Pain See vitals flow sheet.     Home Living Family/patient expects to be discharged to:: Private residence Living Arrangements: Children Available Help at Discharge: Family;Available 24 hours/day Type of Home: House Home Access: Level entry     Home Layout: Other (Comment) (split level, 3 steps into den) Home Equipment: Gilford Rile - 2 wheels;Bedside commode (is still searching for a shower chair to borrow)      Prior Function Level of Independence: Independent with assistive device(s)         Comments: used a cane, still drives        Extremity/Trunk Assessment   Upper Extremity Assessment: Defer to OT evaluation           Lower Extremity Assessment: RLE deficits/detail RLE Deficits / Details: normal post-op pain and weakness.  Pt has 3/5 ankle, 3/5 knee, 2+/5 hip    Cervical / Trunk Assessment: Normal  Communication   Communication: No difficulties  Cognition Arousal/Alertness: Awake/alert Behavior During Therapy: WFL for tasks assessed/performed Overall Cognitive Status: Within Functional Limits for tasks assessed  Exercises Total Joint Exercises Ankle Circles/Pumps: AROM;Both;20 reps;Seated Quad Sets: AROM;Both;10 reps;Supine Short Arc Quad: AROM;Right;10 reps;Seated Heel Slides: AROM;Right;10 reps;Seated Hip ABduction/ADduction: AROM;Right;10 reps;Seated Long Arc Quad: AROM;Right;10 reps;Seated      Assessment/Plan    PT Assessment Patient needs continued PT services  PT Diagnosis Difficulty walking;Abnormality of gait;Generalized weakness;Acute pain   PT Problem List Decreased strength;Decreased range of  motion;Decreased activity tolerance;Decreased balance;Decreased mobility;Decreased knowledge of use of DME;Decreased knowledge of precautions;Pain  PT Treatment Interventions DME instruction;Gait training;Functional mobility training;Stair training;Therapeutic activities;Balance training;Therapeutic exercise;Neuromuscular re-education;Patient/family education;Modalities   PT Goals (Current goals can be found in the Care Plan section) Acute Rehab PT Goals Patient Stated Goal: to go home today PT Goal Formulation: With patient Time For Goal Achievement: 03/30/14 Potential to Achieve Goals: Good    Frequency 7X/week    End of Session   Activity Tolerance: Patient tolerated treatment well Patient left: in chair;with call bell/phone within reach;with family/visitor present           Time: 1100-1135 PT Time Calculation (min): 35 min  Charges:   PT Evaluation $Initial PT Evaluation Tier I: 1 Procedure PT Treatments $Gait Training: 8-22 mins        Tamecca Artiga B. Dooms, [place], DPT [phone]   03/23/2014, 12:36 PM

## 2014-03-23 NOTE — Progress Notes (Signed)
Subjective: 1 Day Post-Op Procedure(s) (LRB): TOTAL HIP ARTHROPLASTY ANTERIOR APPROACH (Right) Patient reports pain as 1 on 0-10 scale.  No nausea/vomiting.  Minimal lightheadedness upon sitting up. No flatus and no bm as of yet.  Tolerating diet. Eager to go home today  Objective: Vital signs in last 24 hours: Temp:  [97.5 F (36.4 C)-98.7 F (37.1 C)] 98.7 F (37.1 C) (06/11 0624) Pulse Rate:  [60-95] 94 (06/11 0624) Resp:  [15-20] 18 (06/11 0624) BP: (130-195)/(49-72) 152/62 mmHg (06/11 0624) SpO2:  [96 %-100 %] 99 % (06/11 0624)  Intake/Output from previous day: 06/10 0701 - 06/11 0700 In: 1820 [P.O.:120; I.V.:1700] Out: -  Intake/Output this shift:    No results found for this basename: HGB,  in the last 72 hours No results found for this basename: WBC, RBC, HCT, PLT,  in the last 72 hours No results found for this basename: NA, K, CL, CO2, BUN, CREATININE, GLUCOSE, CALCIUM,  in the last 72 hours No results found for this basename: LABPT, INR,  in the last 72 hours  Neurologically intact Neurovascular intact Sensation intact distally Intact pulses distally Dorsiflexion/Plantar flexion intact Compartment soft Negative Homen's bilaterally No drainage noted through dressing  Assessment/Plan: 1 Day Post-Op Procedure(s) (LRB): TOTAL HIP ARTHROPLASTY ANTERIOR APPROACH (Right) Advance diet Up with therapy Discharge home with home health most likely today [employer], M. Mendel Ryder 03/23/2014, 6:41 AM

## 2014-03-23 NOTE — Care Management Note (Signed)
CARE MANAGEMENT NOTE 03/23/2014  Patient:  Courtney Proctor, Courtney Proctor   Account Number:  [id number]  Date Initiated:  03/23/2014  Documentation initiated by:  Ricki Miller  Subjective/Objective Assessment:   78 yrold female s/p right total hip arthroplasty.     Action/Plan:   Case Manager spoke with patient concerning home health and DME needs at discharge. Choice offered.Patient has family support at discharge. Referral called to [facility] [place]. Has rolling walker, 3in1.   Anticipated DC Date:  03/23/2014   Anticipated DC Plan:  [place]  CM consult      PAC Choice  [place]   Choice offered to / List presented to:  C-1 Patient        [place] arranged  [place] [facility]      [place].   Status of service:  Completed, signed off Medicare Important Message given?  NA - LOS <3 / Initial given by admissions (If response is "NO", the following Medicare IM given date fields will be blank) Date Medicare IM given:   Date Additional Medicare IM given:    Discharge Disposition:  [place]  Per UR Regulation:

## 2014-03-23 NOTE — Evaluation (Signed)
Occupational Therapy Evaluation and Discharge Patient Details Name: Courtney Proctor MRN: [id number] DOB: [birth year] Today's Date: 03/23/2014    History of Present Illness 78 y.o. female admitted to [facility] on 03/22/14 s/p elective anterior THA.  She has significant PMHx of L posterior THA (2013).   Clinical Impression   Pt presents with general weakness and limited ROM from above interfering with her ability to do her ADLs independently. Pt was educated on and practiced a tub transfer using a shower seat with min assist for safety and equipment management. Pt has AE and DME from previous THA and was at a modified independence level prior to admission. Pt will have 24 hour supervision and assistance at home from family during her recovery. No further OT needs necessary, we will sign off.    Follow Up Recommendations  Supervision/Assistance - 24 hour          Precautions / Restrictions Precautions Precautions: Other (comment) (conflicting orders in chart) Precaution Comments: post precautions ordered, but PT orders state anterior approach. Pt stated that MD told her she had no restrictions but we followed anterior approach precautions. Restrictions RLE Weight Bearing: Weight bearing as tolerated      Mobility Bed Mobility               General bed mobility comments: pt up in chair at beginning of tx  Transfers Overall transfer level: Needs assistance Equipment used: Rolling walker (2 wheeled)   Sit to Stand: Supervision         General transfer comment: supervision for safety due to stiffness from sitting up for a long period without moving.    Balance Overall balance assessment: Needs assistance Sitting-balance support: Feet supported Sitting balance-Leahy Scale: Good     Standing balance support: Bilateral upper extremity supported Standing balance-Leahy Scale: Poor                              ADL Overall ADL's : Needs  assistance/impaired Eating/Feeding: Independent;Sitting   Grooming: Modified independent;Sitting   Upper Body Bathing: Sitting; Set up    Lower Body Bathing: With adaptive equipment;Sit to/from stand;With caregiver independent assisting   Upper Body Dressing : Set up;Sitting   Lower Body Dressing: With adaptive equipment;Sit to/from stand;With caregiver independent assisting   Toilet Transfer: Supervision/safety;BSC;Ambulation;RW   Toileting- Clothing Manipulation and Hygiene: Supervision/safety;Sit to/from stand   Tub/ Shower Transfer: Tub transfer;Minimal assistance;Shower seat;Ambulation;Rolling walker;Cueing for sequencing   Functional mobility during ADLs: Minimal assistance General ADL Comments: Educated on tub transfer technique using shower chair and how to manipulate DME and shower door. Pt practiced backing up to shower chair and steping over the side of the tub with the left (strong) side first, and educated the family on ways they can assist her with that. Pt had other hip done a couple of years ago and is familiar with compensatory strategies for LB ADLs.                Pertinent Vitals/Pain No c/o pain.        Extremity/Trunk Assessment Upper Extremity Assessment Upper Extremity Assessment: Overall WFL for tasks assessed   Lower Extremity Assessment Lower Extremity Assessment: Defer to PT evaluation    Cervical / Trunk Assessment Cervical / Trunk Assessment: Normal   Communication Communication Communication: No difficulties   Cognition Arousal/Alertness: Awake/alert Behavior During Therapy: WFL for tasks assessed/performed Overall Cognitive Status: Within Functional Limits for tasks assessed  Home Living Family/patient expects to be discharged to:: Private residence Living Arrangements: Children Available Help at Discharge: Family;Available 24 hours/day Type of Home: House Home Access: Level entry      Home Layout: Other (Comment) (3 steps into den)     Bathroom Shower/Tub: Tub/shower unit [employer] characteristics: Door Bathroom Toilet: Standard (uses BSC over toilet)     Home Equipment: Environmental consultant - 2 wheels;Bedside commode;Hand held shower head;Adaptive equipment;Cane - single point;Grab bars - tub/shower Adaptive Equipment: Reacher;Sock aid Additional Comments: from previous hip surgery pt has AE and verbalized knowledge of use      Prior Functioning/Environment Level of Independence: Independent with assistive device(s)        Comments: used a cane, still drives             OT Goals(Current goals can be found in the care plan section) Acute Rehab OT Goals Patient Stated Goal: to go home today OT Goal Formulation: With patient Time For Goal Achievement: 03/30/14 Potential to Achieve Goals: Good  OT Frequency:      End of Session Equipment Utilized During Treatment: Gait belt;Rolling walker Nurse Communication:  (question about hip precaution conflicting information)  Activity Tolerance: Patient tolerated treatment well Patient left: in chair;with call bell/phone within reach;with family/visitor present   Time:  -    Charges:    G-CodesLyda Perone 25-Mar-2014, 4:12 PM

## 2019-02-23 DIAGNOSIS — R05 Cough: Secondary | ICD-10-CM | POA: Diagnosis not present

## 2019-02-23 DIAGNOSIS — R0600 Dyspnea, unspecified: Secondary | ICD-10-CM | POA: Diagnosis not present

## 2019-03-02 DIAGNOSIS — Z0189 Encounter for other specified special examinations: Secondary | ICD-10-CM | POA: Diagnosis not present

## 2019-03-02 DIAGNOSIS — R0602 Shortness of breath: Secondary | ICD-10-CM | POA: Diagnosis not present

## 2019-03-02 DIAGNOSIS — R05 Cough: Secondary | ICD-10-CM | POA: Diagnosis not present

## 2019-03-02 DIAGNOSIS — R0600 Dyspnea, unspecified: Secondary | ICD-10-CM | POA: Diagnosis not present

## 2019-03-02 DIAGNOSIS — R600 Localized edema: Secondary | ICD-10-CM | POA: Diagnosis not present

## 2019-03-02 DIAGNOSIS — R030 Elevated blood-pressure reading, without diagnosis of hypertension: Secondary | ICD-10-CM | POA: Diagnosis not present

## 2019-03-03 ENCOUNTER — Inpatient Hospital Stay (HOSPITAL_COMMUNITY)
Admission: AD | Admit: 2019-03-03 | Discharge: 2019-03-05 | DRG: 811 | Discharge status: Against Medical Advice | Payer: Medicare Other | Source: Ambulatory Visit | Attending: Internal Medicine | Admitting: Internal Medicine

## 2019-03-03 DIAGNOSIS — R011 Cardiac murmur, unspecified: Secondary | ICD-10-CM | POA: Diagnosis present

## 2019-03-03 DIAGNOSIS — I5021 Acute systolic (congestive) heart failure: Secondary | ICD-10-CM | POA: Diagnosis not present

## 2019-03-03 DIAGNOSIS — D649 Anemia, unspecified: Secondary | ICD-10-CM | POA: Diagnosis present

## 2019-03-03 DIAGNOSIS — Z9842 Cataract extraction status, left eye: Secondary | ICD-10-CM

## 2019-03-03 DIAGNOSIS — R0600 Dyspnea, unspecified: Secondary | ICD-10-CM | POA: Diagnosis not present

## 2019-03-03 DIAGNOSIS — Z961 Presence of intraocular lens: Secondary | ICD-10-CM | POA: Diagnosis not present

## 2019-03-03 DIAGNOSIS — Z9841 Cataract extraction status, right eye: Secondary | ICD-10-CM

## 2019-03-03 DIAGNOSIS — Z20828 Contact with and (suspected) exposure to other viral communicable diseases: Secondary | ICD-10-CM | POA: Diagnosis present

## 2019-03-03 DIAGNOSIS — K5909 Other constipation: Secondary | ICD-10-CM | POA: Diagnosis not present

## 2019-03-03 DIAGNOSIS — E876 Hypokalemia: Secondary | ICD-10-CM | POA: Diagnosis not present

## 2019-03-03 DIAGNOSIS — Z96641 Presence of right artificial hip joint: Secondary | ICD-10-CM | POA: Diagnosis not present

## 2019-03-03 DIAGNOSIS — M1990 Unspecified osteoarthritis, unspecified site: Secondary | ICD-10-CM | POA: Diagnosis not present

## 2019-03-03 DIAGNOSIS — Z7982 Long term (current) use of aspirin: Secondary | ICD-10-CM | POA: Diagnosis not present

## 2019-03-03 DIAGNOSIS — Z79899 Other long term (current) drug therapy: Secondary | ICD-10-CM

## 2019-03-03 DIAGNOSIS — D509 Iron deficiency anemia, unspecified: Principal | ICD-10-CM | POA: Diagnosis present

## 2019-03-03 DIAGNOSIS — I4891 Unspecified atrial fibrillation: Secondary | ICD-10-CM | POA: Diagnosis present

## 2019-03-03 DIAGNOSIS — D473 Essential (hemorrhagic) thrombocythemia: Secondary | ICD-10-CM | POA: Diagnosis not present

## 2019-03-03 DIAGNOSIS — D75839 Thrombocytosis, unspecified: Secondary | ICD-10-CM

## 2019-03-03 DIAGNOSIS — D500 Iron deficiency anemia secondary to blood loss (chronic): Secondary | ICD-10-CM | POA: Diagnosis not present

## 2019-03-03 LAB — PREPARE RBC (CROSSMATCH)

## 2019-03-03 LAB — BASIC METABOLIC PANEL
Anion gap: 12 (ref 5–15)
BUN: 10 mg/dL (ref 8–23)
CO2: 21 mmol/L — ABNORMAL LOW (ref 22–32)
Calcium: 8.2 mg/dL — ABNORMAL LOW (ref 8.9–10.3)
Chloride: 102 mmol/L (ref 98–111)
Creatinine, Ser: 0.86 mg/dL (ref 0.44–1.00)
GFR calc Af Amer: >60 mL/min (ref >60)
GFR calc non Af Amer: >60 mL/min (ref >60)
Glucose, Bld: 112 mg/dL — ABNORMAL HIGH (ref 70–99)
Potassium: 3.7 mmol/L (ref 3.5–5.1)
Sodium: 135 mmol/L (ref 135–145)

## 2019-03-03 LAB — CBC WITH DIFFERENTIAL/PLATELET
Abs Immature Granulocytes: 0.05 10*3/uL (ref 0.00–0.07)
Basophils Absolute: 0.1 10*3/uL (ref 0.0–0.1)
Basophils Relative: 1 %
Eosinophils Absolute: 0.2 10*3/uL (ref 0.0–0.5)
Eosinophils Relative: 3 %
HCT: 24.7 % — ABNORMAL LOW (ref 36.0–46.0)
Hemoglobin: 6.3 g/dL — CL (ref 12.0–15.0)
Immature Granulocytes: 1 %
Lymphocytes Relative: 12 %
Lymphs Abs: 0.9 10*3/uL (ref 0.7–4.0)
MCH: 16.4 pg — ABNORMAL LOW (ref 26.0–34.0)
MCHC: 25.5 g/dL — ABNORMAL LOW (ref 30.0–36.0)
MCV: 64.5 fL — ABNORMAL LOW (ref 80.0–100.0)
Monocytes Absolute: 0.6 10*3/uL (ref 0.1–1.0)
Monocytes Relative: 8 %
Neutro Abs: 5.6 10*3/uL (ref 1.7–7.7)
Neutrophils Relative %: 75 %
Platelets: 412 10*3/uL — ABNORMAL HIGH (ref 150–400)
RBC: 3.83 MIL/uL — ABNORMAL LOW (ref 3.87–5.11)
RDW: 21.2 % — ABNORMAL HIGH (ref 11.5–15.5)
WBC: 7.5 10*3/uL (ref 4.0–10.5)
nRBC: 0 % (ref 0.0–0.2)

## 2019-03-03 LAB — IRON AND TIBC
Iron: 9 ug/dL — ABNORMAL LOW (ref 28–170)
Saturation Ratios: 2 % — ABNORMAL LOW (ref 10.4–31.8)
TIBC: 497 ug/dL — ABNORMAL HIGH (ref 250–450)
UIBC: 488 ug/dL

## 2019-03-03 LAB — ABO/RH: ABO/RH(D): O POS

## 2019-03-03 LAB — SARS CORONAVIRUS 2 BY RT PCR (HOSPITAL ORDER, PERFORMED IN ~~LOC~~ HOSPITAL LAB): SARS Coronavirus 2: NEGATIVE

## 2019-03-03 LAB — TRANSFERRIN: Transferrin: 359 mg/dL (ref 192–382)

## 2019-03-03 LAB — FERRITIN: Ferritin: 9 ng/mL — ABNORMAL LOW (ref 11–307)

## 2019-03-03 MED ORDER — SODIUM CHLORIDE 0.9% IV SOLUTION: Freq: Once | INTRAVENOUS | Status: DC

## 2019-03-03 MED ORDER — BISACODYL 5 MG PO TBEC: 5.0000 mg | DELAYED_RELEASE_TABLET | Freq: Every day | ORAL | Status: DC | PRN

## 2019-03-03 MED ORDER — FUROSEMIDE 10 MG/ML IJ SOLN
40.0000 mg | Freq: Once | INTRAMUSCULAR | Status: DC
  Filled 2019-03-03: qty 4

## 2019-03-03 MED ORDER — FUROSEMIDE 10 MG/ML IJ SOLN
40.0000 mg | Freq: Once | INTRAMUSCULAR | Status: AC
  Administered 2019-03-03: 40 mg via INTRAVENOUS
  Filled 2019-03-03: qty 4

## 2019-03-03 MED ORDER — OXYCODONE-ACETAMINOPHEN 5-325 MG PO TABS
1.0000 | ORAL_TABLET | ORAL | Status: DC | PRN
Start: 2019-03-03 — End: 2019-03-05

## 2019-03-03 MED ORDER — ONDANSETRON HCL 4 MG PO TABS
4.0000 mg | ORAL_TABLET | Freq: Three times a day (TID) | ORAL | Status: DC | PRN
Start: 2019-03-03 — End: 2019-03-05

## 2019-03-03 MED ORDER — ACETAMINOPHEN 325 MG PO TABS: 650.0000 mg | ORAL_TABLET | Freq: Four times a day (QID) | ORAL | Status: DC | PRN

## 2019-03-03 MED ORDER — ONDANSETRON HCL 4 MG/2ML IJ SOLN: 4.0000 mg | Freq: Four times a day (QID) | INTRAMUSCULAR | Status: DC | PRN

## 2019-03-03 MED ORDER — ACETAMINOPHEN 650 MG RE SUPP: 650.0000 mg | Freq: Four times a day (QID) | RECTAL | Status: DC | PRN

## 2019-03-03 MED ORDER — ONDANSETRON HCL 4 MG PO TABS: 4.0000 mg | ORAL_TABLET | Freq: Four times a day (QID) | ORAL | Status: DC | PRN

## 2019-03-03 MED ORDER — METOPROLOL TARTRATE 25 MG PO TABS
25.0000 mg | ORAL_TABLET | Freq: Two times a day (BID) | ORAL | Status: DC
  Administered 2019-03-03 – 2019-03-05 (×4): 25 mg via ORAL
  Filled 2019-03-03 (×5): qty 1

## 2019-03-03 NOTE — Progress Notes (Signed)
Pt arrived to unit and transferred to bed. Identified appropriately and assessed. Pt alert and oriented, placed on telemetry and instructed to cal wll if needing to ambulate. Call bell within reach and bed in lowest position. Will continue to monitor.

## 2019-03-03 NOTE — Progress Notes (Signed)
Notified by CCMD that patient in new onset Afib. Notified Dr. Cathlean Sauer.

## 2019-03-03 NOTE — Progress Notes (Signed)
CRITICAL VALUE ALERT  Critical Value:  Hgb 6.3  Date & Time Notied:  03/03/19 2:55 PM  Provider Notified: Cathlean Sauer, MD  Orders Received/Actions taken: Awaiting new orders.

## 2019-03-03 NOTE — H&P (Signed)
History and Physical    Courtney Proctor EUM:[id number] DOB: [birth year] DOA: 03/03/2019  PCP: Marjean Donna, MD (Inactive)   Patient coming from: Dr. Nevada Crane office direct admission.   Chief Complaint: Persistent dyspnea.   HPI: Courtney Proctor is a 83 y.o. female with medical history significant of arthritis and history of rheumatic fever presents with dyspnea and cough.  Patient reported 2 months of persistent symptoms, she was seen by Dr. Nevada Crane yesterday she was found hemodynamically stable with trace lower extremity edema, further work-up revealed a hemoglobin of 5.9.  No obvious source of bleeding.  Patient was referred to the hospital for further management.  Patient reports 2 months of persistent and steady dyspnea, worse with exertion, no improving factors, associated with decreased energy, cough and easy fatigability.  Over the last 2 weeks she has noted worsening lower extremity edema.  She was initially treated with symptomatic agents with no improvement of her symptoms.    ED Course: Patient was directly admitted from Dr. Nevada Crane office due to persistent dyspnea. Outpatient work up revealed Hgb down to 5.9. Patient hemodynamically stable, with blood pressure 150/90, no resting tachycardia. No evidence of active bleeding.   Review of Systems:  1. General: No fevers, no chills, no weight gain or weight loss 2. ENT: No runny nose or sore throat, no hearing disturbances 3. Pulmonary: positive dyspnea and cough, no wheezing, or hemoptysis 4. Cardiovascular: No angina, claudication, lower extremity edema, pnd or orthopnea 5. Gastrointestinal: No nausea or vomiting, no diarrhea or constipation.  No melena, hematochezia or hematemesis. 6. Hematology: No easy bruisability or frequent infections. 7. Urology: No dysuria, hematuria or increased urinary frequency.  No hematuria 8. Dermatology: No rashes. 9. Neurology: No seizures or paresthesias 10. Musculoskeletal: No joint pain or  deformities  Past Medical History:  Diagnosis Date   Arthritis    hip   Early cataracts, bilateral    Heart murmur    rhuematic fever at age 37;slight murmur from this   Hemorrhoids    Joint pain    left hip   Nocturia     Past Surgical History:  Procedure Laterality Date   CATARACT EXTRACTION W/PHACO Right 05/09/2013   Procedure: CATARACT EXTRACTION PHACO AND INTRAOCULAR LENS PLACEMENT ([place]);  Surgeon: Tonny Branch, MD;  Location: [facility];  Service: Ophthalmology;  Laterality: Right;  CDE 28.35   CATARACT EXTRACTION W/PHACO Left 05/19/2013   Procedure: CATARACT EXTRACTION PHACO AND INTRAOCULAR LENS PLACEMENT ([facility]);  Surgeon: Tonny Branch, MD;  Location: [facility];  Service: Ophthalmology;  Laterality: Left;  CDE:28.11   TOTAL HIP ARTHROPLASTY  11/12/2011   Procedure: TOTAL HIP ARTHROPLASTY;  Surgeon: Ninetta Lights, MD;  Location: [place];  Service: Orthopedics;  Laterality: Left;  120 MINUTES FOR THIS SURGERY   TOTAL HIP ARTHROPLASTY Right 03/22/2014   Procedure: TOTAL HIP ARTHROPLASTY ANTERIOR APPROACH;  Surgeon: Ninetta Lights, MD;  Location: [place];  Service: Orthopedics;  Laterality: Right;     reports that she has never smoked. She does not have any smokeless tobacco history on file. She reports that she does not drink alcohol or use drugs.  No Known Allergies  Family History  Problem Relation Age of Onset   Anesthesia problems Neg Hx    Hypotension Neg Hx    Malignant hyperthermia Neg Hx    Pseudochol deficiency Neg Hx      Prior to Admission medications   Medication Sig Start Date End Date Taking? Authorizing Provider  aspirin EC 325 MG  tablet Take 1 tablet (325 mg total) by mouth daily. 03/22/14   Aundra Dubin, PA-C  bisacodyl (DULCOLAX) 5 MG EC tablet Take 1 tablet (5 mg total) by mouth daily as needed for moderate constipation. 03/22/14   Aundra Dubin, PA-C  methocarbamol (ROBAXIN) 500 MG tablet Take 1 tablet (500 mg total) by mouth 4 (four) times daily.  03/22/14   Aundra Dubin, PA-C  ondansetron (ZOFRAN) 4 MG tablet Take 1 tablet (4 mg total) by mouth every 8 (eight) hours as needed for nausea or vomiting. 03/22/14   Aundra Dubin, PA-C  oxyCODONE-acetaminophen (ROXICET) 5-325 MG per tablet Take 1-2 tablets by mouth every 4 (four) hours as needed. 03/22/14   Aundra Dubin, PA-C    Physical Exam: There were no vitals filed for this visit.  There were no vitals filed for this visit. General: deconditioned  Neurology: Awake and alert, non focal Head and Neck. Head normocephalic. Neck supple with no adenopathy or thyromegaly.   E ENT: positive pallor, no icterus, oral mucosa moist Cardiovascular: No JVD. S1-S2 present, irregularly irregular, no gallops, rubs, 2/6 systolic murmur at the base with no radiation. ++ pitting bilateral lower extremity edema. Pulmonary: positive breath sounds bilaterally, no wheezing, rhonchi or rales. Gastrointestinal. Abdomen flat, no organomegaly, non tender, no rebound or guarding Skin. No rashes Musculoskeletal: no joint deformities    Labs on Admission: I have personally reviewed following labs and imaging studies  CBC: No results for input(s): WBC, NEUTROABS, HGB, HCT, MCV, PLT in the last 168 hours. Basic Metabolic Panel: No results for input(s): NA, K, CL, CO2, GLUCOSE, BUN, CREATININE, CALCIUM, MG, PHOS in the last 168 hours. GFR: CrCl cannot be calculated (Patient's most recent lab result is older than the maximum 21 days allowed.). Liver Function Tests: No results for input(s): AST, ALT, ALKPHOS, BILITOT, PROT, ALBUMIN in the last 168 hours. No results for input(s): LIPASE, AMYLASE in the last 168 hours. No results for input(s): AMMONIA in the last 168 hours. Coagulation Profile: No results for input(s): INR, PROTIME in the last 168 hours. Cardiac Enzymes: No results for input(s): CKTOTAL, CKMB, CKMBINDEX, TROPONINI in the last 168 hours. BNP (last 3 results) No results for input(s):  PROBNP in the last 8760 hours. HbA1C: No results for input(s): HGBA1C in the last 72 hours. CBG: No results for input(s): GLUCAP in the last 168 hours. Lipid Profile: No results for input(s): CHOL, HDL, LDLCALC, TRIG, CHOLHDL, LDLDIRECT in the last 72 hours. Thyroid Function Tests: No results for input(s): TSH, T4TOTAL, FREET4, T3FREE, THYROIDAB in the last 72 hours. Anemia Panel: No results for input(s): VITAMINB12, FOLATE, FERRITIN, TIBC, IRON, RETICCTPCT in the last 72 hours. Urine analysis:    Component Value Date/Time   COLORURINE YELLOW 03/22/2014 0958   APPEARANCEUR TURBID (A) 03/22/2014 0958   LABSPEC 1.027 03/22/2014 0958   PHURINE 8.5 (H) 03/22/2014 0958   GLUCOSEU 100 (A) 03/22/2014 0958   HGBUR NEGATIVE 03/22/2014 0958   BILIRUBINUR SMALL (A) 03/22/2014 0958   KETONESUR NEGATIVE 03/22/2014 0958   PROTEINUR 100 (A) 03/22/2014 0958   UROBILINOGEN 0.2 03/22/2014 0958   NITRITE NEGATIVE 03/22/2014 0958   LEUKOCYTESUR TRACE (A) 03/22/2014 0958    Radiological Exams on Admission: No results found.  EKG: Independently reviewed. Na  Assessment/Plan Principal Problem:   Anemia Active Problems:   Arthritis   Thrombocytosis ([place])  83 year old female who presents with persistent dyspnea for last 2 months, associated with decreased energy, easy fatigability and cough.  Complicated  by 2-week history of worsening lower extremity edema.  On her initial physical examination her temperature is 97.2, blood pressure 162/71, heart rate 91, respiratory rate 18, oxygen saturation 100% on room air.  She is pale, her lungs are clear to auscultation, heart S1-S2 present, irregularly irregular, 2-6 systolic murmur at the base, abdomen soft, 2+ pitting bilateral extremity edema.  Sodium 135, potassium 3.7, chloride 102, bicarb 21, glucose 112, BUN 10, creatinine 0.86, iron 9, TIBC 497, transferrin saturation 2, ferritin 9, transferrin 359, white count 7.5, hemoglobin 6.3, hematocrit 24.7,  platelets 412.  SARS COVID-19 negative.  EKG 89 bpm, normal axis, atrial fibrillation rhythm, normal QTc, no ST segment or T wave changes.  1.  Symptomatic anemia with thrombocytosis.  Repeat hemoglobin today 6.3, hematocrit 24.7.  Iron stores with severe iron deficiency.  Will transfuse 1 packed red blood cells, will plan to administer IV iron before discharge.  No signs of active bleeding.  2.  New onset atrial fibrillation.  It is possible that her symptoms have been aggravated by atrial fibrillation.  Will start patient with metoprolol 25 mg twice daily for rate control, continue telemetry monitoring.  Hold anticoagulation for now until no further evidence of active bleeding.  Will do an echocardiogram, she does have a 2 out of 6 systolic murmur at the base, and history of rheumatic fever.  Need to rule out valvulopathy aortic or mitral stenosis.  Will order 40 mg of IV Lasix after PRBC transfusion.  2.  Chronic arthritis.  Pain seems to be well controlled.  Codon/acetaminophen.  3.  Chronic constipation.  Continue as needed bisacodyl.   DVT prophylaxis:  scd  Code Status:  full  Family Communication: no family at the bedside   Disposition Plan: telemetry    Consults called: none  Admission status: Observation     Journey Ratterman Gerome Apley MD [facility]   03/03/2019, 2:11 PM

## 2019-03-04 ENCOUNTER — Other Ambulatory Visit: Payer: Self-pay

## 2019-03-04 ENCOUNTER — Encounter (HOSPITAL_COMMUNITY): Payer: Self-pay

## 2019-03-04 ENCOUNTER — Observation Stay (HOSPITAL_BASED_OUTPATIENT_CLINIC_OR_DEPARTMENT_OTHER): Payer: Medicare Other

## 2019-03-04 DIAGNOSIS — Z9842 Cataract extraction status, left eye: Secondary | ICD-10-CM | POA: Diagnosis not present

## 2019-03-04 DIAGNOSIS — K5909 Other constipation: Secondary | ICD-10-CM | POA: Diagnosis present

## 2019-03-04 DIAGNOSIS — D500 Iron deficiency anemia secondary to blood loss (chronic): Secondary | ICD-10-CM | POA: Diagnosis not present

## 2019-03-04 DIAGNOSIS — D649 Anemia, unspecified: Secondary | ICD-10-CM | POA: Diagnosis not present

## 2019-03-04 DIAGNOSIS — Z9841 Cataract extraction status, right eye: Secondary | ICD-10-CM | POA: Diagnosis not present

## 2019-03-04 DIAGNOSIS — I340 Nonrheumatic mitral (valve) insufficiency: Secondary | ICD-10-CM | POA: Diagnosis not present

## 2019-03-04 DIAGNOSIS — Z79899 Other long term (current) drug therapy: Secondary | ICD-10-CM | POA: Diagnosis not present

## 2019-03-04 DIAGNOSIS — Z20828 Contact with and (suspected) exposure to other viral communicable diseases: Secondary | ICD-10-CM | POA: Diagnosis present

## 2019-03-04 DIAGNOSIS — J90 Pleural effusion, not elsewhere classified: Secondary | ICD-10-CM | POA: Diagnosis not present

## 2019-03-04 DIAGNOSIS — R918 Other nonspecific abnormal finding of lung field: Secondary | ICD-10-CM | POA: Diagnosis not present

## 2019-03-04 DIAGNOSIS — D473 Essential (hemorrhagic) thrombocythemia: Secondary | ICD-10-CM | POA: Diagnosis not present

## 2019-03-04 DIAGNOSIS — Z96641 Presence of right artificial hip joint: Secondary | ICD-10-CM | POA: Diagnosis present

## 2019-03-04 DIAGNOSIS — E876 Hypokalemia: Secondary | ICD-10-CM | POA: Diagnosis present

## 2019-03-04 DIAGNOSIS — I5021 Acute systolic (congestive) heart failure: Secondary | ICD-10-CM

## 2019-03-04 DIAGNOSIS — I361 Nonrheumatic tricuspid (valve) insufficiency: Secondary | ICD-10-CM

## 2019-03-04 DIAGNOSIS — I517 Cardiomegaly: Secondary | ICD-10-CM | POA: Diagnosis not present

## 2019-03-04 DIAGNOSIS — Z7982 Long term (current) use of aspirin: Secondary | ICD-10-CM | POA: Diagnosis not present

## 2019-03-04 DIAGNOSIS — M1990 Unspecified osteoarthritis, unspecified site: Secondary | ICD-10-CM | POA: Diagnosis not present

## 2019-03-04 DIAGNOSIS — I4891 Unspecified atrial fibrillation: Secondary | ICD-10-CM | POA: Diagnosis not present

## 2019-03-04 DIAGNOSIS — R0600 Dyspnea, unspecified: Secondary | ICD-10-CM | POA: Diagnosis present

## 2019-03-04 DIAGNOSIS — D509 Iron deficiency anemia, unspecified: Secondary | ICD-10-CM | POA: Diagnosis present

## 2019-03-04 DIAGNOSIS — Z961 Presence of intraocular lens: Secondary | ICD-10-CM | POA: Diagnosis present

## 2019-03-04 DIAGNOSIS — R011 Cardiac murmur, unspecified: Secondary | ICD-10-CM | POA: Diagnosis present

## 2019-03-04 LAB — TYPE AND SCREEN
ABO/RH(D): O POS
Antibody Screen: NEGATIVE
Unit division: 0

## 2019-03-04 LAB — CBC
HCT: 27.7 % — ABNORMAL LOW (ref 36.0–46.0)
Hemoglobin: 7.8 g/dL — ABNORMAL LOW (ref 12.0–15.0)
MCH: 19 pg — ABNORMAL LOW (ref 26.0–34.0)
MCHC: 28.2 g/dL — ABNORMAL LOW (ref 30.0–36.0)
MCV: 67.6 fL — ABNORMAL LOW (ref 80.0–100.0)
Platelets: 403 10*3/uL — ABNORMAL HIGH (ref 150–400)
RBC: 4.1 MIL/uL (ref 3.87–5.11)
RDW: 25.4 % — ABNORMAL HIGH (ref 11.5–15.5)
WBC: 7.8 10*3/uL (ref 4.0–10.5)
nRBC: 0 % (ref 0.0–0.2)

## 2019-03-04 LAB — ECHOCARDIOGRAM COMPLETE

## 2019-03-04 LAB — BASIC METABOLIC PANEL
Anion gap: 11 (ref 5–15)
BUN: 11 mg/dL (ref 8–23)
CO2: 21 mmol/L — ABNORMAL LOW (ref 22–32)
Calcium: 8.1 mg/dL — ABNORMAL LOW (ref 8.9–10.3)
Chloride: 102 mmol/L (ref 98–111)
Creatinine, Ser: 0.86 mg/dL (ref 0.44–1.00)
GFR calc Af Amer: >60 mL/min (ref >60)
GFR calc non Af Amer: >60 mL/min (ref >60)
Glucose, Bld: 98 mg/dL (ref 70–99)
Potassium: 3.3 mmol/L — ABNORMAL LOW (ref 3.5–5.1)
Sodium: 134 mmol/L — ABNORMAL LOW (ref 135–145)

## 2019-03-04 LAB — BPAM RBC
Blood Product Expiration Date: 202006142359
ISSUE DATE / TIME: 202005211643
Unit Type and Rh: 5100

## 2019-03-04 MED ORDER — APIXABAN 5 MG PO TABS
5.0000 mg | ORAL_TABLET | Freq: Two times a day (BID) | ORAL | Status: DC
  Administered 2019-03-04 – 2019-03-05 (×3): 5 mg via ORAL
  Filled 2019-03-04 (×3): qty 1

## 2019-03-04 MED ORDER — SODIUM CHLORIDE 0.9 % IV SOLN
510.0000 mg | Freq: Once | INTRAVENOUS | Status: AC
  Administered 2019-03-04: 18:00:00 510 mg via INTRAVENOUS
  Filled 2019-03-04: qty 17

## 2019-03-04 MED ORDER — POTASSIUM CHLORIDE 20 MEQ PO PACK
40.0000 meq | PACK | ORAL | Status: AC
  Administered 2019-03-04 (×2): 40 meq via ORAL
  Filled 2019-03-04 (×2): qty 2

## 2019-03-04 MED ORDER — FUROSEMIDE 10 MG/ML IJ SOLN
40.0000 mg | Freq: Every day | INTRAMUSCULAR | Status: DC
  Administered 2019-03-04 – 2019-03-05 (×2): 40 mg via INTRAVENOUS
  Filled 2019-03-04 (×2): qty 4

## 2019-03-04 NOTE — Progress Notes (Signed)
*  PRELIMINARY RESULTS* Echocardiogram 2D Echocardiogram has been performed.  Samuel Germany 03/04/2019, 10:18 AM

## 2019-03-04 NOTE — Progress Notes (Signed)
ANTICOAGULATION CONSULT NOTE - Initial Consult  Pharmacy Consult for apixaban dosing Indication: non-valvular atrial fibrillation  No Known Allergies  Patient Measurements:     Vital Signs: Temp: 98.5 F (36.9 C) (05/22 1300) Temp Source: Oral (05/22 0542) BP: 168/77 (05/22 1300) Pulse Rate: 77 (05/22 0542)  Labs: Recent Labs    03/03/19 1438 03/04/19 0511  HGB 6.3* 7.8*  HCT 24.7* 27.7*  PLT 412* 403*  CREATININE 0.86 0.86    CrCl cannot be calculated (Unknown ideal weight.).   Medical History: Past Medical History:  Diagnosis Date  . Arthritis    hip  . Early cataracts, bilateral   . Heart murmur    rhuematic fever at age 74;slight murmur from this  . Hemorrhoids   . Joint pain    left hip  . Nocturia      Assessment: Pharmacy consulted to dose apixaban for this 83 yo female with new-onset, non-valvular atrial fibrillation. Patient hasn't been on any anti-coagulants prior to admission.  Goal of Therapy:   Monitor platelets by anticoagulation protocol: Yes   Plan:  Start apixaban 5mg  bid  Continue to monitor CBC and BMP for any necessary dose adjustments in future.  Despina Pole 03/04/2019,2:04 PM

## 2019-03-04 NOTE — Plan of Care (Signed)

## 2019-03-04 NOTE — Progress Notes (Signed)
PROGRESS NOTE    Courtney Proctor  BPZ:[id number] DOB: [birth year] DOA: 03/03/2019 PCP: Celene Squibb, MD    Brief Narrative:  83 year old female who presents with persistent dyspnea for last 2 months, associated with decreased energy, easy fatigability and cough.  Complicated by 2-week history of worsening lower extremity edema.  On her initial physical examination her temperature is 97.2, blood pressure 162/71, heart rate 91, respiratory rate 18, oxygen saturation 100% on room air.  She is pale, her lungs are clear to auscultation, heart S1-S2 present, irregularly irregular, 2-6 systolic murmur at the base, abdomen soft, 2+ pitting bilateral extremity edema.  Sodium 135, potassium 3.7, chloride 102, bicarb 21, glucose 112, BUN 10, creatinine 0.86, iron 9, TIBC 497, transferrin saturation 2, ferritin 9, transferrin 359, white count 7.5, hemoglobin 6.3, hematocrit 24.7, platelets 412.  SARS COVID-19 negative.  EKG 89 bpm, normal axis, atrial fibrillation rhythm, normal QTc, no ST segment or T wave changes.  Patient was admitted with the working diagnosis of symptomatic anemia.  During her hospitalization, telemetry revealed atrial fibrillation, confirmed by 12 lead ekg.   Assessment & Plan:   Principal Problem:   Anemia Active Problems:   Arthritis   Thrombocytosis (HCC)   1. Acute heart failure, suspected systolic, decompensation, complicated with new onset atrial fibrillation. Patient continue to be hypervolemic, telemetry with persistent atrial fibrillation. Will continue rate control with metoprolol and will start anticoagulation with apixaban. ChadsVas 2 score is 4. Will follow with echocardiogram report. Patient had history of rheumatic fever.   2. Symptomatic iron deficiency anemia. Patient is sp one unit prbc, with Hgb up to 7,8 and hct at 27,7. Serum Iron is 9, TIBC 497, transferrin saturation is 2, transferrin 359 and ferritin 9. Will add one dose of IV iron. No signs of active  bleeding. Risk vs benefit will start patient on apixaban for now.   3. Hypokalemia. Likely related to diuresis, will continue K correction with Kcl x2 doses of 40 meq.   4. Arthritis. Pain controlled, out of bed as tolerated.   DVT prophylaxis: apixaban   Code Status: full Family Communication: no family at the bedside  Disposition Plan/ discharge barriers: pending clinical improvement   There is no height or weight on file to calculate BMI. Malnutrition Type:      Malnutrition Characteristics:      Nutrition Interventions:     RN Pressure Injury Documentation:     Consultants:     Procedures:     Antimicrobials:       Subjective: Patient feeling better, but continue to have dyspnea, cough and lower extremity edema, not yet back to baseline, no nausea or vomiting, no chest pain.   Objective: Vitals:   03/03/19 2000 03/03/19 2229 03/04/19 0542 03/04/19 1300  BP: (!) 171/96 (!) 168/97 (!) 179/96 (!) 168/77  Pulse: 88 84 77   Resp:  20 20 18   Temp: 98.7 F (37.1 C) 98.3 F (36.8 C) 98.2 F (36.8 C) 98.5 F (36.9 C)  TempSrc: Oral Oral Oral   SpO2: 97% 95% 97%     Intake/Output Summary (Last 24 hours) at 03/04/2019 1521 Last data filed at 03/04/2019 1300 Gross per 24 hour  Intake 920 ml  Output --  Net 920 ml   There were no vitals filed for this visit.  Examination:   General: deconditioned and noted dyspnea.  Neurology: Awake and alert, non focal  E ENT: mild  pallor, no icterus, oral mucosa moist Cardiovascular: No JVD. S1-S2  present, rhythmic, no gallops, rubs, 2/6 systolic murmur at the base. +++ pitting lower extremity edema. Pulmonary: positive breath sounds bilaterally, no wheezing, or rhonchi, positive bilateral rales. Gastrointestinal. Abdomen with no organomegaly, non tender, no rebound or guarding Skin. No rashes Musculoskeletal: no joint deformities     Data Reviewed: I have personally reviewed following labs and imaging  studies  CBC: Recent Labs  Lab 03/03/19 1438 03/04/19 0511  WBC 7.5 7.8  NEUTROABS 5.6  --   HGB 6.3* 7.8*  HCT 24.7* 27.7*  MCV 64.5* 67.6*  PLT 412* 235*   Basic Metabolic Panel: Recent Labs  Lab 03/03/19 1438 03/04/19 0511  NA 135 134*  K 3.7 3.3*  CL 102 102  CO2 21* 21*  GLUCOSE 112* 98  BUN 10 11  CREATININE 0.86 0.86  CALCIUM 8.2* 8.1*   GFR: CrCl cannot be calculated (Unknown ideal weight.). Liver Function Tests: No results for input(s): AST, ALT, ALKPHOS, BILITOT, PROT, ALBUMIN in the last 168 hours. No results for input(s): LIPASE, AMYLASE in the last 168 hours. No results for input(s): AMMONIA in the last 168 hours. Coagulation Profile: No results for input(s): INR, PROTIME in the last 168 hours. Cardiac Enzymes: No results for input(s): CKTOTAL, CKMB, CKMBINDEX, TROPONINI in the last 168 hours. BNP (last 3 results) No results for input(s): PROBNP in the last 8760 hours. HbA1C: No results for input(s): HGBA1C in the last 72 hours. CBG: No results for input(s): GLUCAP in the last 168 hours. Lipid Profile: No results for input(s): CHOL, HDL, LDLCALC, TRIG, CHOLHDL, LDLDIRECT in the last 72 hours. Thyroid Function Tests: No results for input(s): TSH, T4TOTAL, FREET4, T3FREE, THYROIDAB in the last 72 hours. Anemia Panel: Recent Labs    03/03/19 1438  FERRITIN 9*  TIBC 497*  IRON 9*      Radiology Studies: I have reviewed all of the imaging during this hospital visit personally     Scheduled Meds:  sodium chloride   Intravenous Once   apixaban  5 mg Oral BID   furosemide  40 mg Intravenous Daily   metoprolol tartrate  25 mg Oral BID   potassium chloride  40 mEq Oral Q4H   Continuous Infusions:   LOS: 1 day        [name], MD

## 2019-03-05 ENCOUNTER — Inpatient Hospital Stay (HOSPITAL_COMMUNITY): Payer: Medicare Other

## 2019-03-05 DIAGNOSIS — D500 Iron deficiency anemia secondary to blood loss (chronic): Secondary | ICD-10-CM

## 2019-03-05 LAB — CBC WITH DIFFERENTIAL/PLATELET
Abs Immature Granulocytes: 0.08 10*3/uL — ABNORMAL HIGH (ref 0.00–0.07)
Basophils Absolute: 0.1 10*3/uL (ref 0.0–0.1)
Basophils Relative: 1 %
Eosinophils Absolute: 0.3 10*3/uL (ref 0.0–0.5)
Eosinophils Relative: 4 %
HCT: 28.3 % — ABNORMAL LOW (ref 36.0–46.0)
Hemoglobin: 7.6 g/dL — ABNORMAL LOW (ref 12.0–15.0)
Immature Granulocytes: 1 %
Lymphocytes Relative: 17 %
Lymphs Abs: 1.3 10*3/uL (ref 0.7–4.0)
MCH: 18.7 pg — ABNORMAL LOW (ref 26.0–34.0)
MCHC: 26.9 g/dL — ABNORMAL LOW (ref 30.0–36.0)
MCV: 69.7 fL — ABNORMAL LOW (ref 80.0–100.0)
Monocytes Absolute: 0.9 10*3/uL (ref 0.1–1.0)
Monocytes Relative: 12 %
Neutro Abs: 5 10*3/uL (ref 1.7–7.7)
Neutrophils Relative %: 65 %
Platelets: 381 10*3/uL (ref 150–400)
RBC: 4.06 MIL/uL (ref 3.87–5.11)
RDW: 25.3 % — ABNORMAL HIGH (ref 11.5–15.5)
WBC: 7.7 10*3/uL (ref 4.0–10.5)
nRBC: 0 % (ref 0.0–0.2)

## 2019-03-05 LAB — BASIC METABOLIC PANEL
Anion gap: 11 (ref 5–15)
BUN: 13 mg/dL (ref 8–23)
CO2: 22 mmol/L (ref 22–32)
Calcium: 8.3 mg/dL — ABNORMAL LOW (ref 8.9–10.3)
Chloride: 101 mmol/L (ref 98–111)
Creatinine, Ser: 0.96 mg/dL (ref 0.44–1.00)
GFR calc Af Amer: >60 mL/min (ref >60)
GFR calc non Af Amer: 56 mL/min — ABNORMAL LOW (ref >60)
Glucose, Bld: 87 mg/dL (ref 70–99)
Potassium: 3.9 mmol/L (ref 3.5–5.1)
Sodium: 134 mmol/L — ABNORMAL LOW (ref 135–145)

## 2019-03-05 MED ORDER — FUROSEMIDE 10 MG/ML IJ SOLN: 40.0000 mg | Freq: Two times a day (BID) | INTRAMUSCULAR | Status: DC

## 2019-03-05 MED ORDER — FUROSEMIDE 40 MG PO TABS: 40.0000 mg | ORAL_TABLET | Freq: Every day | ORAL | 0 refills | Status: DC

## 2019-03-05 MED ORDER — METOPROLOL TARTRATE 25 MG PO TABS: 25.0000 mg | ORAL_TABLET | Freq: Two times a day (BID) | ORAL | 0 refills | Status: DC

## 2019-03-05 MED ORDER — PANTOPRAZOLE SODIUM 40 MG PO TBEC
40.0000 mg | DELAYED_RELEASE_TABLET | Freq: Every day | ORAL | Status: DC
  Administered 2019-03-05: 40 mg via ORAL
  Filled 2019-03-05: qty 1

## 2019-03-05 MED ORDER — POTASSIUM CHLORIDE ER 20 MEQ PO TBCR: 20.0000 meq | EXTENDED_RELEASE_TABLET | Freq: Every day | ORAL | 0 refills | Status: DC

## 2019-03-05 MED ORDER — ASPIRIN EC 81 MG PO TBEC: 81.0000 mg | DELAYED_RELEASE_TABLET | Freq: Every day | ORAL | 0 refills | Status: AC

## 2019-03-05 NOTE — Consult Note (Addendum)
Referring Provider: No ref. provider found Primary Care Physician:  Celene Squibb, MD Primary Gastroenterologist:  Barney Drain  Reason for Consultation:  ANEMIA   Impression: Came TO ED WITH COUGH AND WEAKNESS, & WORKUP REVEALED NEW ONSET AFIB AND PROFOUND IRON DEFICIENCY ANEMIA(Hb 6.9, FERRITIN 9). NO BRBPR OR MELENA.  DIFFERENTIAL DIAGNOSIS INCLUDES: COLON POLYPS, AVMs, COLON CANCER, H PYLORI GASTRITIS, CAMERON'S EROSIONS, ATROPHIC GASTRITIS, OR LESS LIKELY CELIAC SPRUE.  Plan: 1. RECOMMENDED EGD/TCS PRIOR TO DISCHARGE. PT DECLINES EGD/TCS AT THIS TIME. DISCUSSED WHY PT NEEDS EGD/TCS AND DISCUSSED PROCEDURE. Discussed risk of stroke without blood thinners and THAT SHE COULD NOT BE ON BLOOD THINNER UNTIL HER  EGD/TCS WAS COMPLETE. PT VOICED HER UNDERSTANDING. PT DECLINED EGD/TCS THIS ADMISSION. PT WILLING TO REVISIT IN ONE MO. PT UNDERSTANDS IF SHE NEEDS TO BE ADMITTED THEN SHE SHOULD HAVE AN EGD/TCS. My ofc will contact pt IN ONE MO. 2. OK TO USE ASA. AVOID ANTICOAGULATION.  HPI:  Pt c/o COUGH THAT HAS MADE HER FEEL WEAK FOR 3 MOS. USES ALEVE ONCE EVERY 2-3 WEEKS. CAN'T SWALLOW BIG PILLS. GETS SOB IF SHE TALKS TOO MUCH.BMs: DAILY.  PT DENIES FEVER, CHILLS, HEMATOCHEZIA, HEMATEMESIS, nausea, vomiting, melena, diarrhea, CHEST PAIN, CHANGE IN BOWEL IN HABITS, constipation, abdominal pain, problems swallowing, OR heartburn or indigestion.  Past Medical History:  Diagnosis Date  . Arthritis    hip  . Early cataracts, bilateral   . Heart murmur    rhuematic fever at age 46;slight murmur from this  . Hemorrhoids   . Joint pain    left hip  . Nocturia    Past Surgical History:  Procedure Laterality Date  . CATARACT EXTRACTION W/PHACO Right 05/09/2013   Procedure: CATARACT EXTRACTION PHACO AND INTRAOCULAR LENS PLACEMENT (IOC);  Surgeon: Tonny Branch, MD;  Location: [facility];  Service: Ophthalmology;  Laterality: Right;  CDE 28.35  . CATARACT EXTRACTION W/PHACO Left 05/19/2013   Procedure:  CATARACT EXTRACTION PHACO AND INTRAOCULAR LENS PLACEMENT (IOC);  Surgeon: Tonny Branch, MD;  Location: [facility];  Service: Ophthalmology;  Laterality: Left;  CDE:28.11  . TOTAL HIP ARTHROPLASTY  11/12/2011   Procedure: TOTAL HIP ARTHROPLASTY;  Surgeon: Ninetta Lights, MD;  Location: [facility];  Service: Orthopedics;  Laterality: Left;  120 MINUTES FOR THIS SURGERY  . TOTAL HIP ARTHROPLASTY Right 03/22/2014   Procedure: TOTAL HIP ARTHROPLASTY ANTERIOR APPROACH;  Surgeon: Ninetta Lights, MD;  Location: [facility];  Service: Orthopedics;  Laterality: Right;    Prior to Admission medications   Medication Sig Start Date End Date Taking? Authorizing Provider  furosemide (LASIX) 40 MG tablet Take 40 mg by mouth daily.  03/02/19  Yes [provider]  potassium chloride (K-DUR) 10 MEQ tablet Take 10 mEq by mouth daily.  03/02/19  Yes [provider]  PROAIR HFA 108 (90 Base) MCG/ACT inhaler Inhale 1-2 puffs into the lungs every 6 (six) hours as needed for wheezing or shortness of breath.  02/23/19   [provider]    Current Facility-Administered Medications  Medication Dose Route Frequency Provider Last Rate Last Dose  . 0.9 %  sodium chloride infusion (Manually program via Guardrails IV Fluids)   Intravenous Once Arrien, Jimmy Picket, MD      . acetaminophen (TYLENOL) tablet 650 mg  650 mg Oral Q6H PRN Arrien, Jimmy Picket, MD       Or  . acetaminophen (TYLENOL) suppository 650 mg  650 mg Rectal Q6H PRN Arrien, Jimmy Picket, MD      .  bisacodyl (DULCOLAX) EC tablet 5 mg  5 mg Oral Daily PRN Arrien, Jimmy Picket, MD      . furosemide (LASIX) injection 40 mg  40 mg Intravenous Q12H Arrien, Jimmy Picket, MD      . metoprolol tartrate (LOPRESSOR) tablet 25 mg  25 mg Oral BID Tawni Millers, MD   25 mg at 03/05/19 0902  . ondansetron (ZOFRAN) tablet 4 mg  4 mg Oral Q6H PRN Arrien, Jimmy Picket, MD       Or  . ondansetron [facility]) injection 4 mg  4 mg Intravenous  Q6H PRN Arrien, Jimmy Picket, MD      . ondansetron [facility]) tablet 4 mg  4 mg Oral Q8H PRN Arrien, Jimmy Picket, MD      . oxyCODONE-acetaminophen (PERCOCET/ROXICET) 5-325 MG per tablet 1-2 tablet  1-2 tablet Oral Q4H PRN Arrien, Jimmy Picket, MD      . pantoprazole (PROTONIX) EC tablet 40 mg  40 mg Oral Daily Tawni Millers, MD   40 mg at 03/05/19 1149    Allergies as of 03/03/2019  . (No Known Allergies)   Family History  Problem Relation Age of Onset  . Anesthesia problems Neg Hx   . Hypotension Neg Hx   . Malignant hyperthermia Neg Hx   . Pseudochol deficiency Neg Hx    Social History   Socioeconomic History  . Marital status: Widowed    Spouse name: Not on file  . Number of children: Not on file  . Years of education: Not on file  . Highest education level: Not on file  Occupational History  . Not on file  Social Needs  . Financial resource strain: Not very hard  . Food insecurity:    Worry: Never true    Inability: Never true  . Transportation needs:    Medical: No    Non-medical: No  Tobacco Use  . Smoking status: Never Smoker  . Smokeless tobacco: Never Used  Substance and Sexual Activity  . Alcohol use: No  . Drug use: No  . Sexual activity: Never    Birth control/protection: Post-menopausal  Lifestyle  . Physical activity:    Days per week: 0 days    Minutes per session: 0 min  . Stress: Only a little  Relationships  . Social connections:    Talks on phone: Not on file    Gets together: Not on file    Attends religious service: Not on file    Active member of club or organization: Not on file    Attends meetings of clubs or organizations: Not on file    Relationship status: Widowed  . Intimate partner violence:    Fear of current or ex partner: No    Emotionally abused: No    Physically abused: No    Forced sexual activity: No  Other Topics Concern  . Not on file  Social History Narrative  . Not on file   Review of  Systems: PER HPI OTHERWISE ALL SYSTEMS ARE NEGATIVE.  Vitals: Blood pressure 129/70, pulse 78, temperature 98.1 F (36.7 C), temperature source Oral, resp. rate 16, SpO2 94 %.  Physical Exam: General:   Alert,  Well-developed, well-nourished, pleasant and cooperative in NAD Head:  Normocephalic and atraumatic. Eyes:  Sclera clear, no icterus.   Conjunctiva pink. Mouth:  No lesions, dentition ABnormal. Neck:  Supple; no masses. Lungs:  Clear throughout to auscultation.   No wheezes. No acute distress. Heart:  Regular rate and rhythm; no  murmurs. Abdomen:  Soft, nontender and nondistended. No masses noted. Normal bowel sounds, without guarding, and without rebound.   Msk:  Symmetrical with gross deformities. Normal posture. Extremities:  Without edema. Neurologic:  Alert and  oriented x4; NO  NEW FOCAL DEFICITS Psych:  Alert and cooperative. SLIGHTLY ANXIOUS mood and NORMAL affect.  Lab Results: Recent Labs    03/03/19 1438 03/04/19 0511 03/05/19 0607  WBC 7.5 7.8 7.7  HGB 6.3* 7.8* 7.6*  HCT 24.7* 27.7* 28.3*  PLT 412* 403* 381   BMET Recent Labs    03/04/19 0511 03/05/19 0607  NA 134* 134*  K 3.3* 3.9  CL 102 101  CO2 21* 22  GLUCOSE 98 87  BUN 11 13  CREATININE 0.86 0.96  CALCIUM 8.1* 8.3*   LFT No results for input(s): PROT, ALBUMIN, AST, ALT, ALKPHOS, BILITOT, BILIDIR, IBILI in the last 72 hours.   Studies/Results: CXR PA/LAT: LARGE HIATAL HERNIA, SML R PLEURAL EFFUSION   LOS: 2 days   Tobin Cadiente  03/05/2019, 12:27 PM

## 2019-03-05 NOTE — Plan of Care (Signed)
Patient left AMA.

## 2019-03-05 NOTE — Discharge Summary (Addendum)
Physician Discharge Summary  Courtney Proctor:[id number] DOB: 1936-04-09 DOA: 03/03/2019  PCP: Celene Squibb, MD  Admit date: 03/03/2019 Discharge date: 03/05/2019  Admitted From: Home  Disposition:  Home   Recommendations for Outpatient Follow-up and new medication changes:  1. Patient has decided to leave [facility], I have explained her that she still has extra fluid in her lungs, there is risk of worsening, leading to low oxygen levels, respiratory failure and death.  She fully understands but she is adamant about leaving the hospital, I asked her if there is anything I can do to help her stay but she is very firm in her decision to leave the hospital.  I have called her daughter to inform her about Courtney Proctor's decision.  Currently patient seems to be competent to make her own decisions even though I think leaving the hospital Courtney Proctor is not in her best interest.  2. I will send to her pharmacy a prescription for metoprolol 25 mg bid, 40 mg of Lasix and 20 meq of potassium chloride. 3. She has been advised to follow-up with Dr. [place] Crane as an outpatient. 4. She has declined endoscopy, as part of anemia work-up. 5. Patient will be discharged on aspirin for now. 6. She will follow-up with Dr. Oneida Alar in the outpatient GI clinic for possible outpatient endoscopy.   Brief/Interim Summary: 83 year old female who presents with persistent dyspnea for last 2 months, associated with decreased energy, easy fatigability and cough. Complicated by 2-week history of worsening lower extremity edema. On her initial physical examination her temperature is 97.2, blood pressure 162/71, heart rate 91, respiratory rate 18, oxygen saturation 100% on room air. She is pale, her lungs are clear to auscultation, heart S1-S2 present, irregularly irregular, 2-6systolic murmur at the base, abdomen soft, 2+ pitting bilateral extremity edema.Sodium 135, potassium 3.7, chloride 102, bicarb 21,  glucose 112, BUN 10, creatinine 0.86, iron 9, TIBC 497, transferrin saturation 2, ferritin 9, transferrin 359,white count 7.5, hemoglobin 6.3, hematocrit 24.7, platelets 412.SARS COVID-19 negative.EKG 89 bpm, normal axis, atrial fibrillation rhythm, normal QTc,no ST segment or T wave changes.  Patient was admitted with the working diagnosis of symptomatic anemia.  During her hospitalization, telemetry revealed atrial fibrillation, confirmed by 12 lead ekg  For further details please see daily progress note dated Mar 04, 2009  Discharge Diagnoses:  Principal Problem:   Anemia Active Problems:   Arthritis   Thrombocytosis ([place])    Discharge Instructions   Allergies as of 03/05/2019   No Known Allergies     Medication List    TAKE these medications   aspirin EC 81 MG tablet Take 1 tablet (81 mg total) by mouth daily for 30 days.   furosemide 40 MG tablet Commonly known as:  LASIX Take 1 tablet (40 mg total) by mouth daily for 30 days.   metoprolol tartrate 25 MG tablet Commonly known as:  LOPRESSOR Take 1 tablet (25 mg total) by mouth 2 (two) times daily for 30 days.   Potassium Chloride ER 20 MEQ Tbcr Take 20 mEq by mouth daily for 30 days. Take only while taking furosemide. What changed:    medication strength  how much to take  additional instructions   ProAir HFA 108 (90 Base) MCG/ACT inhaler Generic drug:  albuterol Inhale 1-2 puffs into the lungs every 6 (six) hours as needed for wheezing or shortness of breath.       No Known Allergies  Consultations:  Cardiology over the phone  GI    Procedures/Studies:  No results found.   Procedures:    Discharge Exam: Vitals:   03/04/19 2136 03/05/19 0529  BP: (!) 150/56 129/70  Pulse: 81 78  Resp: 16 16  Temp: (!) 97 F (36.1 C) 98.1 F (36.7 C)  SpO2: 97% 94%   Vitals:   03/04/19 2050 03/04/19 2136 03/04/19 2136 03/05/19 0529  BP:  (!) 150/56 (!) 150/56 129/70  Pulse: 94 75 81 78   Resp: 20  16 16   Temp:   (!) 97 F (36.1 C) 98.1 F (36.7 C)  TempSrc:   Oral Oral  SpO2: 98%  97% 94%      The results of significant diagnostics from this hospitalization (including imaging, microbiology, ancillary and laboratory) are listed below for reference.     Microbiology: Recent Results (from the past 240 hour(s))  SARS Coronavirus 2 [facility] order, Performed in [facility] hospital lab)     Status: None   Collection Time: 03/03/19  2:09 PM  Result Value Ref Range Status   SARS Coronavirus 2 NEGATIVE NEGATIVE Final    Comment: (NOTE) If result is NEGATIVE SARS-CoV-2 target nucleic acids are NOT DETECTED. The SARS-CoV-2 RNA is generally detectable in upper and lower  respiratory specimens during the acute phase of infection. The lowest  concentration of SARS-CoV-2 viral copies this assay can detect is 250  copies / mL. A negative result does not preclude SARS-CoV-2 infection  and should not be used as the sole basis for treatment or other  patient management decisions.  A negative result may occur with  improper specimen collection / handling, submission of specimen other  than nasopharyngeal swab, presence of viral mutation(s) within the  areas targeted by this assay, and inadequate number of viral copies  (<250 copies / mL). A negative result must be combined with clinical  observations, patient history, and epidemiological information. If result is POSITIVE SARS-CoV-2 target nucleic acids are DETECTED. The SARS-CoV-2 RNA is generally detectable in upper and lower  respiratory specimens dur ing the acute phase of infection.  Positive  results are indicative of active infection with SARS-CoV-2.  Clinical  correlation with patient history and other diagnostic information is  necessary to determine patient infection status.  Positive results do  not rule out bacterial infection or co-infection with other viruses. If result is PRESUMPTIVE POSTIVE SARS-CoV-2  nucleic acids MAY BE PRESENT.   A presumptive positive result was obtained on the submitted specimen  and confirmed on repeat testing.  While 2019 novel coronavirus  (SARS-CoV-2) nucleic acids may be present in the submitted sample  additional confirmatory testing may be necessary for epidemiological  and / or clinical management purposes  to differentiate between  SARS-CoV-2 and other Sarbecovirus currently known to infect humans.  If clinically indicated additional testing with an alternate test  methodology [phone]) is advised. The SARS-CoV-2 RNA is generally  detectable in upper and lower respiratory sp ecimens during the acute  phase of infection. The expected result is Negative. Fact Sheet for Patients:  StrictlyIdeas.no Fact Sheet for Healthcare Providers: BankingDealers.co.za This test is not yet approved or cleared by the [place] FDA and has been authorized for detection and/or diagnosis of SARS-CoV-2 by FDA under an Emergency Use Authorization (EUA).  This EUA will remain in effect (meaning this test can be used) for the duration of the COVID-19 declaration under Section 564(b)(1) of the Act, 21 U.S.C. section 360bbb-3(b)(1), unless the authorization is terminated or revoked sooner. Performed at  [address] [place], [place] [address]      Labs: BNP (last 3 results) No results for input(s): BNP in the last 8760 hours. Basic Metabolic Panel: Recent Labs  Lab 03/03/19 1438 03/04/19 0511 03/05/19 0607  NA 135 134* 134*  K 3.7 3.3* 3.9  CL 102 102 101  CO2 21* 21* 22  GLUCOSE 112* 98 87  BUN 10 11 13   CREATININE 0.86 0.86 0.96  CALCIUM 8.2* 8.1* 8.3*   Liver Function Tests: No results for input(s): AST, ALT, ALKPHOS, BILITOT, PROT, ALBUMIN in the last 168 hours. No results for input(s): LIPASE, AMYLASE in the last 168 hours. No results for input(s): AMMONIA in the last 168 hours. CBC: Recent  Labs  Lab 03/03/19 1438 03/04/19 0511 03/05/19 0607  WBC 7.5 7.8 7.7  NEUTROABS 5.6  --  5.0  HGB 6.3* 7.8* 7.6*  HCT 24.7* 27.7* 28.3*  MCV 64.5* 67.6* 69.7*  PLT 412* 403* 381   Cardiac Enzymes: No results for input(s): CKTOTAL, CKMB, CKMBINDEX, TROPONINI in the last 168 hours. BNP: Invalid input(s): POCBNP CBG: No results for input(s): GLUCAP in the last 168 hours. D-Dimer No results for input(s): DDIMER in the last 72 hours. Hgb A1c No results for input(s): HGBA1C in the last 72 hours. Lipid Profile No results for input(s): CHOL, HDL, LDLCALC, TRIG, CHOLHDL, LDLDIRECT in the last 72 hours. Thyroid function studies No results for input(s): TSH, T4TOTAL, T3FREE, THYROIDAB in the last 72 hours.  Invalid input(s): FREET3 Anemia work up Recent Labs    03/03/19 1438  FERRITIN 9*  TIBC 497*  IRON 9*   Urinalysis    Component Value Date/Time   COLORURINE YELLOW 03/22/2014 0958   APPEARANCEUR TURBID (A) 03/22/2014 0958   LABSPEC 1.027 03/22/2014 0958   PHURINE 8.5 (H) 03/22/2014 0958   GLUCOSEU 100 (A) 03/22/2014 0958   HGBUR NEGATIVE 03/22/2014 0958   BILIRUBINUR SMALL (A) 03/22/2014 0958   KETONESUR NEGATIVE 03/22/2014 0958   PROTEINUR 100 (A) 03/22/2014 0958   UROBILINOGEN 0.2 03/22/2014 0958   NITRITE NEGATIVE 03/22/2014 0958   LEUKOCYTESUR TRACE (A) 03/22/2014 0958   Sepsis Labs Invalid input(s): PROCALCITONIN,  WBC,  LACTICIDVEN Microbiology Recent Results (from the past 240 hour(s))  SARS Coronavirus 2 [facility] order, Performed in [place] hospital lab)     Status: None   Collection Time: 03/03/19  2:09 PM  Result Value Ref Range Status   SARS Coronavirus 2 NEGATIVE NEGATIVE Final    Comment: (NOTE) If result is NEGATIVE SARS-CoV-2 target nucleic acids are NOT DETECTED. The SARS-CoV-2 RNA is generally detectable in upper and lower  respiratory specimens during the acute phase of infection. The lowest  concentration of SARS-CoV-2 viral copies this  assay can detect is 250  copies / mL. A negative result does not preclude SARS-CoV-2 infection  and should not be used as the sole basis for treatment or other  patient management decisions.  A negative result may occur with  improper specimen collection / handling, submission of specimen other  than nasopharyngeal swab, presence of viral mutation(s) within the  areas targeted by this assay, and inadequate number of viral copies  (<250 copies / mL). A negative result must be combined with clinical  observations, patient history, and epidemiological information. If result is POSITIVE SARS-CoV-2 target nucleic acids are DETECTED. The SARS-CoV-2 RNA is generally detectable in upper and lower  respiratory specimens dur ing the acute phase of infection.  Positive  results are indicative of  active infection with SARS-CoV-2.  Clinical  correlation with patient history and other diagnostic information is  necessary to determine patient infection status.  Positive results do  not rule out bacterial infection or co-infection with other viruses. If result is PRESUMPTIVE POSTIVE SARS-CoV-2 nucleic acids MAY BE PRESENT.   A presumptive positive result was obtained on the submitted specimen  and confirmed on repeat testing.  While 2019 novel coronavirus  (SARS-CoV-2) nucleic acids may be present in the submitted sample  additional confirmatory testing may be necessary for epidemiological  and / or clinical management purposes  to differentiate between  SARS-CoV-2 and other Sarbecovirus currently known to infect humans.  If clinically indicated additional testing with an alternate test  methodology [phone]) is advised. The SARS-CoV-2 RNA is generally  detectable in upper and lower respiratory sp ecimens during the acute  phase of infection. The expected result is Negative. Fact Sheet for Patients:  StrictlyIdeas.no Fact Sheet for Healthcare  Providers: BankingDealers.co.za This test is not yet approved or cleared by the [place] FDA and has been authorized for detection and/or diagnosis of SARS-CoV-2 by FDA under an Emergency Use Authorization (EUA).  This EUA will remain in effect (meaning this test can be used) for the duration of the COVID-19 declaration under Section 564(b)(1) of the Act, 21 U.S.C. section 360bbb-3(b)(1), unless the authorization is terminated or revoked sooner. Performed at [facility], [address] [place], [place] [address]      Time coordinating discharge: 45 minutes  SIGNED:   Tawni Millers, MD  [facility] 03/05/2019, 1:16 PM

## 2019-03-05 NOTE — Progress Notes (Addendum)
PROGRESS NOTE    Courtney Proctor  PYK:[id number] DOB: [birth year] DOA: 03/03/2019 PCP: Celene Squibb, MD    Brief Narrative:  83 year old female who presents with persistent dyspnea for last 2 months, associated with decreased energy, easy fatigability and cough. Complicated by 2-week history of worsening lower extremity edema. On her initial physical examination her temperature is 97.2, blood pressure 162/71, heart rate 91, respiratory rate 18, oxygen saturation 100% on room air. She is pale, her lungs are clear to auscultation, heart S1-S2 present, irregularly irregular, 2-6systolic murmur at the base, abdomen soft, 2+ pitting bilateral extremity edema.Sodium 135, potassium 3.7, chloride 102, bicarb 21, glucose 112, BUN 10, creatinine 0.86, iron 9, TIBC 497, transferrin saturation 2, ferritin 9, transferrin 359,white count 7.5, hemoglobin 6.3, hematocrit 24.7, platelets 412.SARS COVID-19 negative.EKG 89 bpm, normal axis, atrial fibrillation rhythm, normal QTc,no ST segment or T wave changes.  Patient was admitted with the working diagnosis of symptomatic anemia.  During her hospitalization, telemetry revealed atrial fibrillation, confirmed by 12 lead ekg   Assessment & Plan:   Principal Problem:   Anemia Active Problems:   Arthritis   Thrombocytosis ([place])   1. Acute heart failure, suspected systolic, decompensation, complicated with new onset atrial fibrillation. Improved volume status but continue to have lower extremity edema, ++ pitting. Her documented urine output over last 24 H is only 600 ml, will increase furosemide to 40 mg bid. Will check chest radiograph and will continue rate control with metoprolol. Will hold on anticoagulation for now until further GI work up for iron deficiency anemia. Hem occult is pending. I spoke with Dr. Stanford Breed from cardiology and Dr. Oneida Alar from gastroenterology.   2. Symptomatic iron deficiency anemia.  Serum Iron is 9, TIBC 497,  transferrin saturation is 2, transferrin 359 and ferritin 9. Patient is sp one unit PRBC transfusion and one dose of IV iron, her Hgb his 7,6 this am, and her symptoms have improved.   3. Hypokalemia. K is up to 3,9, will give extra 40 meq Kcl today due to increase in diuretic dose. Follow on renal panel in am, avoid hypotension and nephrotoxic medications.   4. Arthritis. No active chest pain, out of bed as tolerated.   Late entry, chest radiograph personally reviewed showing bilateral pleural effusions greater right than left.  DVT prophylaxis: apixaban   Code Status: full Family Communication: no family at the bedside  Disposition Plan/ discharge barriers: Follow with GI recommendations.    There is no height or weight on file to calculate BMI. Malnutrition Type:      Malnutrition Characteristics:      Nutrition Interventions:     RN Pressure Injury Documentation:     Consultants:   GI   Procedures:     Antimicrobials:       Subjective: Patient is feeling better, but not yet back to baseline, continue to have lower extremity edema, dyspnea has been improved, along with cough.   Objective: Vitals:   03/04/19 2050 03/04/19 2136 03/04/19 2136 03/05/19 0529  BP:  (!) 150/56 (!) 150/56 129/70  Pulse: 94 75 81 78  Resp: 20  16 16   Temp:   (!) 97 F (36.1 C) 98.1 F (36.7 C)  TempSrc:   Oral Oral  SpO2: 98%  97% 94%    Intake/Output Summary (Last 24 hours) at 03/05/2019 1042 Last data filed at 03/05/2019 1002 Gross per 24 hour  Intake 717.28 ml  Output 900 ml  Net -182.72 ml   There were  no vitals filed for this visit.  Examination:   General: Not in pain or dyspnea,  Neurology: Awake and alert, non focal  E ENT: mild pallor, no icterus, oral mucosa moist Cardiovascular: No JVD. S1-S2 present, rhythmic, no gallops, rubs, or murmurs. ++ pitting bilateral lower extremity edema. Pulmonary: positive breath sounds bilaterally, adequate air  movement, no wheezing, rhonchi or rales. Gastrointestinal. Abdomen with no organomegaly, non tender, no rebound or guarding Skin. No rashes Musculoskeletal: no joint deformities     Data Reviewed: I have personally reviewed following labs and imaging studies  CBC: Recent Labs  Lab 03/03/19 1438 03/04/19 0511 03/05/19 0607  WBC 7.5 7.8 7.7  NEUTROABS 5.6  --  5.0  HGB 6.3* 7.8* 7.6*  HCT 24.7* 27.7* 28.3*  MCV 64.5* 67.6* 69.7*  PLT 412* 403* 256   Basic Metabolic Panel: Recent Labs  Lab 03/03/19 1438 03/04/19 0511 03/05/19 0607  NA 135 134* 134*  K 3.7 3.3* 3.9  CL 102 102 101  CO2 21* 21* 22  GLUCOSE 112* 98 87  BUN 10 11 13   CREATININE 0.86 0.86 0.96  CALCIUM 8.2* 8.1* 8.3*   GFR: CrCl cannot be calculated (Unknown ideal weight.). Liver Function Tests: No results for input(s): AST, ALT, ALKPHOS, BILITOT, PROT, ALBUMIN in the last 168 hours. No results for input(s): LIPASE, AMYLASE in the last 168 hours. No results for input(s): AMMONIA in the last 168 hours. Coagulation Profile: No results for input(s): INR, PROTIME in the last 168 hours. Cardiac Enzymes: No results for input(s): CKTOTAL, CKMB, CKMBINDEX, TROPONINI in the last 168 hours. BNP (last 3 results) No results for input(s): PROBNP in the last 8760 hours. HbA1C: No results for input(s): HGBA1C in the last 72 hours. CBG: No results for input(s): GLUCAP in the last 168 hours. Lipid Profile: No results for input(s): CHOL, HDL, LDLCALC, TRIG, CHOLHDL, LDLDIRECT in the last 72 hours. Thyroid Function Tests: No results for input(s): TSH, T4TOTAL, FREET4, T3FREE, THYROIDAB in the last 72 hours. Anemia Panel: Recent Labs    03/03/19 1438  FERRITIN 9*  TIBC 497*  IRON 9*      Radiology Studies: I have reviewed all of the imaging during this hospital visit personally     Scheduled Meds: . sodium chloride   Intravenous Once  . apixaban  5 mg Oral BID  . furosemide  40 mg Intravenous Daily   . metoprolol tartrate  25 mg Oral BID   Continuous Infusions:   LOS: 2 days         Gerome Apley, MD

## 2019-03-05 NOTE — Progress Notes (Signed)
Removed IV to left forearm, 2x2 gauze and paper tape applied to site, Dr. Cathlean Sauer aware of patient's wishes to leave AMA, Dr. Cathlean Sauer called new prescriptions to [facility], patient taken down to short stay lobby via wheelchair, patient to be transported home by her daughter.

## 2019-03-08 DIAGNOSIS — D509 Iron deficiency anemia, unspecified: Secondary | ICD-10-CM | POA: Diagnosis not present

## 2019-03-16 DIAGNOSIS — I5031 Acute diastolic (congestive) heart failure: Secondary | ICD-10-CM | POA: Diagnosis not present

## 2019-03-16 DIAGNOSIS — R739 Hyperglycemia, unspecified: Secondary | ICD-10-CM | POA: Diagnosis not present

## 2019-03-16 DIAGNOSIS — D509 Iron deficiency anemia, unspecified: Secondary | ICD-10-CM | POA: Diagnosis not present

## 2019-03-16 DIAGNOSIS — D500 Iron deficiency anemia secondary to blood loss (chronic): Secondary | ICD-10-CM | POA: Diagnosis not present

## 2019-03-16 DIAGNOSIS — R600 Localized edema: Secondary | ICD-10-CM | POA: Diagnosis not present

## 2019-03-16 DIAGNOSIS — I4811 Longstanding persistent atrial fibrillation: Secondary | ICD-10-CM | POA: Diagnosis not present

## 2019-03-16 DIAGNOSIS — R05 Cough: Secondary | ICD-10-CM | POA: Diagnosis not present

## 2019-03-16 DIAGNOSIS — R0600 Dyspnea, unspecified: Secondary | ICD-10-CM | POA: Diagnosis not present

## 2019-03-16 DIAGNOSIS — R7303 Prediabetes: Secondary | ICD-10-CM | POA: Diagnosis not present

## 2019-03-16 DIAGNOSIS — R030 Elevated blood-pressure reading, without diagnosis of hypertension: Secondary | ICD-10-CM | POA: Diagnosis not present

## 2019-03-23 ENCOUNTER — Encounter: Payer: Self-pay | Admitting: Cardiovascular Disease

## 2019-03-31 NOTE — Progress Notes (Signed)
CARDIOLOGY CONSULT NOTE       Patient ID: ADLEIGH MCMASTERS MRN: [id number] DOB/AGE: 04-29-1936 83 y.o.  Admit date: (Not on file) Referring Physician: Nevada Crane Primary Physician: Celene Squibb, MD Primary Cardiologist: None  Reason for Consultation: Dyspnea   Active Problems:   * No active hospital problems. *   HPI:  83 y.o. seen in hospital 03/05/19 left AMA. Dyspnea for 2 months with mailaise and fatigue. Also with LE edema. She was negative for COVID Hct was quite low at 24.7 and she was noted to be in afib. She is Rx with lasix and metoprolol  CXR with significant right effusion ? Atelectasis or associated pneumonia. Also large hiatal hernia . She was transfused one unit but last Hb still only 7.4 Echo done 03/04/19 with EF 55-60% mild MR normal RV AV sclerosis mild AR TTE reviewed images personally She was recommended to have endoscopy with Dr Oneida Alar BUN not elevated , normal Cr normal WBC and PLTls  Post hospital seen by Dr Nevada Crane 03/16/19 He indicated cough and breathing better. On iron for anemia No mention of repeat imaging   She is still stubborn about her w/u Does not want colonoscopy or EGD. She left the hospital because she couldn't see family and it was her son's birthday She has less edema and dyspnea Denies melena or change in bowel habits   I take care of her daughter and son in law   ROS All other systems reviewed and negative except as noted above  Past Medical History:  Diagnosis Date  . Arthritis    hip  . Early cataracts, bilateral   . Heart murmur    rhuematic fever at age 83;slight murmur from this  . Hemorrhoids   . Joint pain    left hip  . Nocturia     Family History  Problem Relation Age of Onset  . Anesthesia problems Neg Hx   . Hypotension Neg Hx   . Malignant hyperthermia Neg Hx   . Pseudochol deficiency Neg Hx     Social History   Socioeconomic History  . Marital status: Widowed    Spouse name: Not on file  . Number of children: Not on file  .  Years of education: Not on file  . Highest education level: Not on file  Occupational History  . Not on file  Social Needs  . Financial resource strain: Not very hard  . Food insecurity    Worry: Never true    Inability: Never true  . Transportation needs    Medical: No    Non-medical: No  Tobacco Use  . Smoking status: Never Smoker  . Smokeless tobacco: Never Used  Substance and Sexual Activity  . Alcohol use: No  . Drug use: No  . Sexual activity: Never    Birth control/protection: Post-menopausal  Lifestyle  . Physical activity    Days per week: 0 days    Minutes per session: 0 min  . Stress: Only a little  Relationships  . Social Herbalist on phone: Not on file    Gets together: Not on file    Attends religious service: Not on file    Active member of club or organization: Not on file    Attends meetings of clubs or organizations: Not on file    Relationship status: Widowed  . Intimate partner violence    Fear of current or ex partner: No    Emotionally abused: No  Physically abused: No    Forced sexual activity: No  Other Topics Concern  . Not on file  Social History Narrative  . Not on file    Past Surgical History:  Procedure Laterality Date  . CATARACT EXTRACTION W/PHACO Right 05/09/2013   Procedure: CATARACT EXTRACTION PHACO AND INTRAOCULAR LENS PLACEMENT (IOC);  Surgeon: Tonny Branch, MD;  Location: [facility];  Service: Ophthalmology;  Laterality: Right;  CDE 28.35  . CATARACT EXTRACTION W/PHACO Left 05/19/2013   Procedure: CATARACT EXTRACTION PHACO AND INTRAOCULAR LENS PLACEMENT (IOC);  Surgeon: Tonny Branch, MD;  Location: [facility];  Service: Ophthalmology;  Laterality: Left;  CDE:28.11  . TOTAL HIP ARTHROPLASTY  11/12/2011   Procedure: TOTAL HIP ARTHROPLASTY;  Surgeon: Ninetta Lights, MD;  Location: [facility];  Service: Orthopedics;  Laterality: Left;  120 MINUTES FOR THIS SURGERY  . TOTAL HIP ARTHROPLASTY Right 03/22/2014   Procedure: TOTAL HIP  ARTHROPLASTY ANTERIOR APPROACH;  Surgeon: Ninetta Lights, MD;  Location: [place];  Service: Orthopedics;  Laterality: Right;        Physical Exam: Blood pressure (!) 157/70, pulse 80, temperature 98.6 F (37 C), height 5\' 1"  (1.549 m).    Affect appropriate Healthy:  appears stated age 83: normal Neck supple with no adenopathy JVP normal no bruits no thyromegaly Lungs clear with no wheezing and good diaphragmatic motion Heart:  S1/S2 no murmur, no rub, gallop or click PMI normal Abdomen: benighn, BS positve, no tenderness, no AAA no bruit.  No HSM or HJR Distal pulses intact with no bruits No edema Neuro non-focal Skin warm and dry No muscular weakness   Labs:   Lab Results  Component Value Date   WBC 7.7 03/05/2019   HGB 7.6 (L) 03/05/2019   HCT 28.3 (L) 03/05/2019   MCV 69.7 (L) 03/05/2019   PLT 381 03/05/2019   No results for input(s): NA, K, CL, CO2, BUN, CREATININE, CALCIUM, PROT, BILITOT, ALKPHOS, ALT, AST, GLUCOSE in the last 168 hours.  Invalid input(s): LABALBU No results found for: CKTOTAL, CKMB, CKMBINDEX, TROPONINI No results found for: CHOL No results found for: HDL No results found for: LDLCALC No results found for: TRIG No results found for: CHOLHDL No results found for: LDLDIRECT    Radiology: Dg Chest 2 View  Result Date: 03/05/2019 CLINICAL DATA:  Dyspnea. EXAM: CHEST - 2 VIEW COMPARISON:  Chest x-ray dated 03/15/2014. FINDINGS: Borderline cardiomegaly. Hiatal hernia, moderate to large in size. Dense opacity at the RIGHT lung base, at least some component representing pleural effusion. Small LEFT pleural effusion. No pneumothorax seen. Osseous structures about the chest are unremarkable. IMPRESSION: 1. Dense opacity at the RIGHT lung base. At least some component of this opacity is due to pleural effusion, likely moderate in size. Suspect some degree of associated atelectasis. Superimposed pneumonia cannot be excluded. 2. Small LEFT pleural  effusion. 3. Borderline cardiomegaly. 4. Hiatal hernia, moderate to large in size. Electronically Signed   By: Franki Cabot M.D.   On: 03/05/2019 13:40    EKG: afib rate 89 nonspecific ST changes 03/03/19   ASSESSMENT AND PLAN:   Dyspnea:  Related to lung process and anemia. Needs f/u imaging suspect chest CT would be better to r/o complex para pneumonic process. Will order  chest CT needs to f/u with primary   Anemia:  Needs further w/u per primary ? Colonoscopy and cologard cards as Ferritin was low at 9 when checked in hospital Needs f/u with Dr Oneida Alar from GI She prefers to take  iron and f/u with primary for lab work   Afib: not a candidate for anticoagulation given anemia. Rate control with metoprolol script refilled echo benign rate control adequate for now   Signed: Jenkins Rouge 04/01/2019, 2:13 PM

## 2019-04-01 ENCOUNTER — Encounter: Payer: Self-pay | Admitting: Cardiovascular Disease

## 2019-04-01 ENCOUNTER — Ambulatory Visit: Payer: Medicare Other | Admitting: Cardiovascular Disease

## 2019-04-01 ENCOUNTER — Other Ambulatory Visit: Payer: Self-pay

## 2019-04-01 VITALS — BP 157/70 | HR 80 | Temp 98.6°F | Ht 61.0 in | Wt 150.0 lb

## 2019-04-01 DIAGNOSIS — Z8701 Personal history of pneumonia (recurrent): Secondary | ICD-10-CM

## 2019-04-01 MED ORDER — METOPROLOL TARTRATE 25 MG PO TABS: 25.0000 mg | ORAL_TABLET | Freq: Two times a day (BID) | ORAL | 3 refills | Status: DC

## 2019-04-01 NOTE — Patient Instructions (Signed)
Medication Instructions: Your physician recommends that you continue on your current medications as directed. Please refer to the Current Medication list given to you today.  Labwork: none  Procedures/Testing: Schedule chest CT without contrast  Follow-Up: 3 months with Dr.Nishan  Any Additional Special Instructions Will Be Listed Below (If Applicable).     If you need a refill on your cardiac medications before your next appointment, please call your pharmacy.      Thank you for choosing [facility] !

## 2019-04-13 DIAGNOSIS — R7303 Prediabetes: Secondary | ICD-10-CM | POA: Diagnosis not present

## 2019-04-13 DIAGNOSIS — R0600 Dyspnea, unspecified: Secondary | ICD-10-CM | POA: Diagnosis not present

## 2019-04-13 DIAGNOSIS — R0602 Shortness of breath: Secondary | ICD-10-CM | POA: Diagnosis not present

## 2019-04-13 DIAGNOSIS — R030 Elevated blood-pressure reading, without diagnosis of hypertension: Secondary | ICD-10-CM | POA: Diagnosis not present

## 2019-04-13 DIAGNOSIS — D500 Iron deficiency anemia secondary to blood loss (chronic): Secondary | ICD-10-CM | POA: Diagnosis not present

## 2019-04-13 DIAGNOSIS — R600 Localized edema: Secondary | ICD-10-CM | POA: Diagnosis not present

## 2019-04-13 DIAGNOSIS — I5031 Acute diastolic (congestive) heart failure: Secondary | ICD-10-CM | POA: Diagnosis not present

## 2019-04-13 DIAGNOSIS — I4811 Longstanding persistent atrial fibrillation: Secondary | ICD-10-CM | POA: Diagnosis not present

## 2019-04-13 DIAGNOSIS — R739 Hyperglycemia, unspecified: Secondary | ICD-10-CM | POA: Diagnosis not present

## 2019-04-19 ENCOUNTER — Telehealth: Payer: Self-pay

## 2019-04-19 ENCOUNTER — Other Ambulatory Visit: Payer: Self-pay

## 2019-04-19 ENCOUNTER — Ambulatory Visit (HOSPITAL_COMMUNITY)
Admission: RE | Admit: 2019-04-19 | Discharge: 2019-04-19 | Disposition: A | Discharge status: Home / Self Care | Payer: Medicare Other | Source: Ambulatory Visit | Attending: Cardiovascular Disease | Admitting: Cardiovascular Disease

## 2019-04-19 DIAGNOSIS — I2510 Atherosclerotic heart disease of native coronary artery without angina pectoris: Secondary | ICD-10-CM | POA: Insufficient documentation

## 2019-04-19 DIAGNOSIS — Z8701 Personal history of pneumonia (recurrent): Secondary | ICD-10-CM | POA: Diagnosis not present

## 2019-04-19 DIAGNOSIS — J90 Pleural effusion, not elsewhere classified: Secondary | ICD-10-CM | POA: Diagnosis not present

## 2019-04-19 DIAGNOSIS — K449 Diaphragmatic hernia without obstruction or gangrene: Secondary | ICD-10-CM | POA: Diagnosis not present

## 2019-04-19 DIAGNOSIS — J189 Pneumonia, unspecified organism: Secondary | ICD-10-CM | POA: Diagnosis not present

## 2019-04-19 DIAGNOSIS — R918 Other nonspecific abnormal finding of lung field: Secondary | ICD-10-CM | POA: Diagnosis not present

## 2019-04-19 NOTE — Telephone Encounter (Signed)
-----   Message from Josue Hector, MD sent at 04/19/2019 11:07 AM EDT ----- No pneumonia lots of parenchymal lung disease f/u with pulmonary

## 2019-04-19 NOTE — Telephone Encounter (Signed)
Called pt. Informed her of results. She voiced understanding. She declines going to a pulmonologist.I will copy this result to her pcp.

## 2019-06-23 ENCOUNTER — Telehealth: Payer: Self-pay | Admitting: Student

## 2019-06-23 NOTE — Telephone Encounter (Signed)
Virtual Visit Pre-Appointment Phone Call  "(Name), I am calling you today to discuss your upcoming appointment. We are currently trying to limit exposure to the virus that causes COVID-19 by seeing patients at home rather than in the office."  1. "What is the BEST phone number to call the day of the visit?" - include this in appointment notes  2. Do you have or have access to (through a family member/friend) a smartphone with video capability that we can use for your visit?" a. If yes - list this number in appt notes as cell (if different from BEST phone #) and list the appointment type as a VIDEO visit in appointment notes b. If no - list the appointment type as a PHONE visit in appointment notes  3. Confirm consent - "In the setting of the current Covid19 crisis, you are scheduled for a (phone or video) visit with your provider on (date) at (time).  Just as we do with many in-office visits, in order for you to participate in this visit, we must obtain consent.  If you'd like, I can send this to your mychart (if signed up) or email for you to review.  Otherwise, I can obtain your verbal consent now.  All virtual visits are billed to your insurance company just like a normal visit would be.  By agreeing to a virtual visit, we'd like you to understand that the technology does not allow for your provider to perform an examination, and thus may limit your provider's ability to fully assess your condition. If your provider identifies any concerns that need to be evaluated in person, we will make arrangements to do so.  Finally, though the technology is pretty good, we cannot assure that it will always work on either your or our end, and in the setting of a video visit, we may have to convert it to a phone-only visit.  In either situation, we cannot ensure that we have a secure connection.  Are you willing to proceed?" STAFF: Did the patient verbally acknowledge consent to telehealth visit? Document  YES/NO here: Yes  4. Advise patient to be prepared - "Two hours prior to your appointment, go ahead and check your blood pressure, pulse, oxygen saturation, and your weight (if you have the equipment to check those) and write them all down. When your visit starts, your provider will ask you for this information. If you have an Apple Watch or Kardia device, please plan to have heart rate information ready on the day of your appointment. Please have a pen and paper handy nearby the day of the visit as well."  5. Give patient instructions for MyChart download to smartphone OR [email or web address] as below if video visit (depending on what platform provider is using)  6. Inform patient they will receive a phone call 15 minutes prior to their appointment time (may be from unknown caller ID) so they should be prepared to answer    TELEPHONE CALL NOTE  Courtney Proctor has been deemed a candidate for a follow-up tele-health visit to limit community exposure during the Covid-19 pandemic. I spoke with the patient via phone to ensure availability of phone/video source, confirm preferred email & phone number, and discuss instructions and expectations.  I reminded Courtney Proctor to be prepared with any vital sign and/or heart rhythm information that could potentially be obtained via home monitoring, at the time of her visit. I reminded Courtney Proctor to expect a phone call prior to  her visit.  Courtney Proctor 06/23/2019 9:44 AM

## 2019-06-28 ENCOUNTER — Telehealth (INDEPENDENT_AMBULATORY_CARE_PROVIDER_SITE_OTHER): Payer: Medicare Other | Admitting: Student

## 2019-06-28 ENCOUNTER — Encounter: Payer: Self-pay | Admitting: Student

## 2019-06-28 ENCOUNTER — Encounter: Payer: Self-pay | Admitting: *Deleted

## 2019-06-28 VITALS — Ht 61.0 in | Wt 154.0 lb

## 2019-06-28 DIAGNOSIS — I480 Paroxysmal atrial fibrillation: Secondary | ICD-10-CM

## 2019-06-28 DIAGNOSIS — R600 Localized edema: Secondary | ICD-10-CM

## 2019-06-28 DIAGNOSIS — R918 Other nonspecific abnormal finding of lung field: Secondary | ICD-10-CM

## 2019-06-28 DIAGNOSIS — D649 Anemia, unspecified: Secondary | ICD-10-CM

## 2019-06-28 MED ORDER — METOPROLOL TARTRATE 25 MG PO TABS: 25.0000 mg | ORAL_TABLET | Freq: Two times a day (BID) | ORAL | 3 refills | Status: DC

## 2019-06-28 NOTE — Progress Notes (Signed)
Virtual Visit via Telephone Note   This visit type was conducted due to national recommendations for restrictions regarding the COVID-19 Pandemic (e.g. social distancing) in an effort to limit this patient's exposure and mitigate transmission in our community.  Due to her co-morbid illnesses, this patient is at least at moderate risk for complications without adequate follow up.  This format is felt to be most appropriate for this patient at this time.  The patient did not have access to video technology/had technical difficulties with video requiring transitioning to audio format only (telephone).  All issues noted in this document were discussed and addressed.  No physical exam could be performed with this format.  Please refer to the patient's chart for her  consent to telehealth for [facility]   Date:  06/28/2019   ID:  Courtney Proctor, DOB Aug 25, 1936, MRN [id number]  Patient Location: Home Provider Location: Office  PCP:  Celene Squibb, MD  Cardiologist:  Jenkins Rouge, MD Electrophysiologist:  None   Evaluation Performed:  Follow-Up Visit  Chief Complaint:  3 month visit  History of Present Illness:    Courtney Proctor is a 83 y.o. female with past medical history of paroxysmal atrial fibrillation and anemia who presents for a 47-month follow-up telehealth visit.  She was last examined by Dr. Johnsie Cancel in 03/2019 for hospital follow-up from her recent admission for evaluation of dyspnea and found to have symptomatic anemia with hemoglobin of 6.3. She received transfusions and GI recommended an EGD but she declined and left AMA. She was found to have atrial fibrillation during admission but was not started on anticoagulation given her anemia. She reported still having dyspnea at the time of her follow-up visit but reported that this had improved and denied any melena or hematochezia. Given her abnormal CXR a Chest CT was recommended and this showed borderline cardiomegaly and a small  dependent right pleural effusion with large hiatal hernia and postinfectious/postinflammatory scarring along the right middle lobe. She did also have scattered pulmonary nodules with follow-up CT recommended in 12 months.  In talking with the patient today, she reports overall doing very well since her last office visit. She denies any recent chest pain, palpitations, dyspnea on exertion, orthopnea, or PND. No recent lower extremity edema and weight has overall been stable on her home scales. Has been walking for exercise and denies any symptoms with this.   She reports having follow-up labs with her PCP and was informed everything was "normal". She is unsure of the exact lab values. Denies any recent melena, hematochezia, or hematuria.  The patient does not have symptoms concerning for COVID-19 infection (fever, chills, cough, or new shortness of breath).    Past Medical History:  Diagnosis Date  . Arthritis    hip  . Early cataracts, bilateral   . Heart murmur    rhuematic fever at age 73;slight murmur from this  . Hemorrhoids   . Joint pain    left hip  . Nocturia    Past Surgical History:  Procedure Laterality Date  . CATARACT EXTRACTION W/PHACO Right 05/09/2013   Procedure: CATARACT EXTRACTION PHACO AND INTRAOCULAR LENS PLACEMENT (IOC);  Surgeon: Tonny Branch, MD;  Location: [facility];  Service: Ophthalmology;  Laterality: Right;  CDE 28.35  . CATARACT EXTRACTION W/PHACO Left 05/19/2013   Procedure: CATARACT EXTRACTION PHACO AND INTRAOCULAR LENS PLACEMENT (IOC);  Surgeon: Tonny Branch, MD;  Location: [facility];  Service: Ophthalmology;  Laterality: Left;  CDE:28.11  . TOTAL HIP  ARTHROPLASTY  11/12/2011   Procedure: TOTAL HIP ARTHROPLASTY;  Surgeon: Ninetta Lights, MD;  Location: [place];  Service: Orthopedics;  Laterality: Left;  120 MINUTES FOR THIS SURGERY  . TOTAL HIP ARTHROPLASTY Right 03/22/2014   Procedure: TOTAL HIP ARTHROPLASTY ANTERIOR APPROACH;  Surgeon: Ninetta Lights, MD;  Location: [place];  Service: Orthopedics;  Laterality: Right;     Current Meds  Medication Sig  . aspirin EC 81 MG tablet Take 81 mg by mouth daily.  . furosemide (LASIX) 40 MG tablet Take 1 tablet (40 mg total) by mouth daily for 30 days.  . metoprolol tartrate (LOPRESSOR) 25 MG tablet Take 1 tablet (25 mg total) by mouth 2 (two) times daily.  . potassium chloride 20 MEQ TBCR Take 20 mEq by mouth daily for 30 days. Take only while taking furosemide.  . [DISCONTINUED] metoprolol tartrate (LOPRESSOR) 25 MG tablet Take 1 tablet (25 mg total) by mouth 2 (two) times daily for 30 days.     Allergies:   Patient has no known allergies.   Social History   Tobacco Use  . Smoking status: Never Smoker  . Smokeless tobacco: Never Used  Substance Use Topics  . Alcohol use: No  . Drug use: No     Family Hx: The patient's family history is negative for Anesthesia problems, Hypotension, Malignant hyperthermia, and Pseudochol deficiency.  ROS:   Please see the history of present illness.     All other systems reviewed and are negative.   Prior CV studies:   The following studies were reviewed today:  Echocardiogram: 03/04/2019 IMPRESSIONS    1. The left ventricle has normal systolic function, with an ejection fraction of 55-60%. The cavity size was normal. Left ventricular diastolic parameters were normal.  2. The right ventricle has normal systolic function. The cavity was normal. There is no increase in right ventricular wall thickness.  3. The mitral valve is degenerative. Moderate thickening of the mitral valve leaflet. Moderate calcification of the mitral valve leaflet. There is mild to moderate mitral annular calcification present.  4. The aortic valve is tricuspid. Moderate thickening of the aortic valve. Sclerosis without any evidence of stenosis of the aortic valve. Aortic valve regurgitation is mild by color flow Doppler.  5. The inferior vena cava was dilated in size with >50% respiratory  variability.  CT Chest: 04/2019 IMPRESSION: 1. Borderline mild cardiomegaly and small dependent right pleural effusion. 2. Large hiatal hernia. 3. No acute consolidative airspace disease to suggest a pneumonia. Postinfectious/postinflammatory scarring in the medial segment right middle lobe. 4. Scattered tiny pulmonary nodules, largest 2 mm. No follow-up needed if patient is low-risk (and has no known or suspected primary neoplasm). Non-contrast chest CT can be considered in 12 months if patient is high-risk. This recommendation follows the consensus statement: Guidelines for Management of Incidental Pulmonary Nodules Detected on CT Images:From the Fleischner Society 2017; published online before print (10.1148/radiol.IJ:2314499). 5. Three-vessel coronary atherosclerosis.  Labs/Other Tests and Data Reviewed:    EKG:  No ECG reviewed.  Recent Labs: 03/05/2019: BUN 13; Creatinine, Ser 0.96; Hemoglobin 7.6; Platelets 381; Potassium 3.9; Sodium 134   Recent Lipid Panel No results found for: CHOL, TRIG, HDL, CHOLHDL, LDLCALC, LDLDIRECT  Wt Readings from Last 3 Encounters:  06/28/19 154 lb (69.9 kg)  04/01/19 150 lb (68 kg)  03/15/14 160 lb (72.6 kg)     Objective:    Vital Signs:  Ht 5\' 1"  (1.549 m)   Wt 154 lb (69.9 kg)  BMI 29.10 kg/m    General: Pleasant female sounding in NAD Psych: Normal affect. Neuro: Alert and oriented X 3.  Lungs:  Resp regular and unlabored while talking on the phone.   ASSESSMENT & PLAN:    1. Paroxysmal Atrial Fibrillation - She denies any recent palpitations but was overall unaware of her arrhythmia during her admission. Unable to check HR or BP at home. I recommended continuing her Lopressor for now but it appears she was in NSR by examination at her office visit in 03/2019. Would continue until her next in-office visit and if still maintaining NSR, could consider using on a PRN basis.  - This patients CHA2DS2-VASc Score and unadjusted  Ischemic Stroke Rate (% per year) is equal to 3.2 % stroke rate/year from a score of 3 (Female, Age (2)). She has not been started on anticoagulation given her recent anemia and declining further GI work-up. Reviewed increased thromboembolic risk with the patient today and she is aware of this. Will continue with ASA 81 mg daily given that she is not a current candidate for anticoagulation and she does not wish to start anticoagulation.  2. Lower Extremity Edema - She reports her symptoms have improved and weight has overall been stable. Continue Lasix 40 mg daily. Will request a copy of recent labs from PCP.  3. Anemia - Hemoglobin was 6.3 during admission earlier this year and she declined further GI evaluation including EGD. She denies any recent melena, hematochezia, or hematuria. Reports hemoglobin was recently checked by her PCP and "normal". Will request a copy of these records.  4. Pulmonary Nodules - recent Chest CT showed scattered tiny pulmonary nodules, largest 2 mm with follow-up non-contrast chest CT recommended in 12 months. Reviewed findings with the patient and recommended repeat imaging next year for re-evaluation which can be arranged by [place] or her PCP.   COVID-19 Education: The signs and symptoms of COVID-19 were discussed with the patient and how to seek care for testing (follow up with PCP or arrange E-visit). The importance of social distancing was discussed today.  Time:   Today, I have spent 16 minutes with the patient with telehealth technology discussing the above problems.     Medication Adjustments/Labs and Tests Ordered: Current medicines are reviewed at length with the patient today.  Concerns regarding medicines are outlined above.   Tests Ordered: No orders of the defined types were placed in this encounter.   Medication Changes: Meds ordered this encounter  Medications  . metoprolol tartrate (LOPRESSOR) 25 MG tablet    Sig: Take 1 tablet (25 mg total) by  mouth 2 (two) times daily.    Dispense:  180 tablet    Refill:  3    Order Specific Question:   Supervising Provider    Answer:   Dorothy Spark [id number]    Follow Up:  In Person in 6 month(s)  Signed, Erma Heritage, PA-C  06/28/2019 4:21 PM    [facility]

## 2019-06-28 NOTE — Patient Instructions (Signed)
Your physician wants you to follow-up in: [place] will receive a reminder letter in the mail two months in advance. If you don't receive a letter, please call our office to schedule the follow-up appointment.  Your physician recommends that you continue on your current medications as directed. Please refer to the Current Medication list given to you today.  Thank you for choosing [facility]!!

## 2019-06-29 ENCOUNTER — Encounter: Payer: Self-pay | Admitting: Internal Medicine

## 2019-07-20 DIAGNOSIS — R7303 Prediabetes: Secondary | ICD-10-CM | POA: Diagnosis not present

## 2019-07-20 DIAGNOSIS — R739 Hyperglycemia, unspecified: Secondary | ICD-10-CM | POA: Diagnosis not present

## 2019-07-20 DIAGNOSIS — D500 Iron deficiency anemia secondary to blood loss (chronic): Secondary | ICD-10-CM | POA: Diagnosis not present

## 2019-07-20 DIAGNOSIS — D509 Iron deficiency anemia, unspecified: Secondary | ICD-10-CM | POA: Diagnosis not present

## 2019-07-25 ENCOUNTER — Encounter: Payer: Self-pay | Admitting: Internal Medicine

## 2019-07-25 DIAGNOSIS — R600 Localized edema: Secondary | ICD-10-CM | POA: Diagnosis not present

## 2019-07-25 DIAGNOSIS — I480 Paroxysmal atrial fibrillation: Secondary | ICD-10-CM | POA: Diagnosis not present

## 2019-07-25 DIAGNOSIS — D500 Iron deficiency anemia secondary to blood loss (chronic): Secondary | ICD-10-CM | POA: Diagnosis not present

## 2019-07-25 DIAGNOSIS — R918 Other nonspecific abnormal finding of lung field: Secondary | ICD-10-CM | POA: Diagnosis not present

## 2019-07-25 DIAGNOSIS — R944 Abnormal results of kidney function studies: Secondary | ICD-10-CM | POA: Diagnosis not present

## 2020-01-10 NOTE — Progress Notes (Signed)
Date:  01/13/2020   ID:  Courtney Proctor, DOB 1936/03/15, MRN [id number]  PCP:  Celene Squibb, MD  Cardiologist:  Jenkins Rouge, MD Electrophysiologist:  None   Evaluation Performed:  Follow-Up Visit   History of Present Illness:    Courtney Proctor is a 84 y.o. female with past medical history of paroxysmal atrial fibrillation and anemia    Seen  03/2019 for hospital follow-up from her recent admission for evaluation of dyspnea and found to have symptomatic anemia with hemoglobin of 6.3. She received transfusions and GI recommended an EGD but she declined and left AMA. She was found to have atrial fibrillation during admission but was not started on anticoagulation given her anemia. She reported still having dyspnea at the time of her follow-up visit but reported that this had improved and denied any melena or hematochezia. Given her abnormal CXR a Chest CT was recommended and this showed borderline cardiomegaly and a small dependent right pleural effusion with large hiatal hernia and postinfectious/postinflammatory scarring along the right middle lobe. She did also have scattered pulmonary nodules with follow-up CT recommended in 12 months.  Echo done 03/04/19 reviewed EF 55-60% MAC mild MR and AV sclerosis and mild AR  . She is adamant about not getting COVID vaccine Doesn't trust it and doesn't get flu shots either    The patient does not have symptoms concerning for COVID-19 infection (fever, chills, cough, or new shortness of breath).    Past Medical History:  Diagnosis Date  . Arthritis    hip  . Early cataracts, bilateral   . Heart murmur    rhuematic fever at age 81;slight murmur from this  . Hemorrhoids   . Joint pain    left hip  . Nocturia    Past Surgical History:  Procedure Laterality Date  . CATARACT EXTRACTION W/PHACO Right 05/09/2013   Procedure: CATARACT EXTRACTION PHACO AND INTRAOCULAR LENS PLACEMENT (IOC);  Surgeon: Tonny Branch, MD;  Location: [facility];  Service:  Ophthalmology;  Laterality: Right;  CDE 28.35  . CATARACT EXTRACTION W/PHACO Left 05/19/2013   Procedure: CATARACT EXTRACTION PHACO AND INTRAOCULAR LENS PLACEMENT (IOC);  Surgeon: Tonny Branch, MD;  Location: [facility];  Service: Ophthalmology;  Laterality: Left;  CDE:28.11  . TOTAL HIP ARTHROPLASTY  11/12/2011   Procedure: TOTAL HIP ARTHROPLASTY;  Surgeon: Ninetta Lights, MD;  Location: [place];  Service: Orthopedics;  Laterality: Left;  120 MINUTES FOR THIS SURGERY  . TOTAL HIP ARTHROPLASTY Right 03/22/2014   Procedure: TOTAL HIP ARTHROPLASTY ANTERIOR APPROACH;  Surgeon: Ninetta Lights, MD;  Location: [place];  Service: Orthopedics;  Laterality: Right;     Current Meds  Medication Sig  . aspirin EC 81 MG tablet Take 81 mg by mouth daily.  . furosemide (LASIX) 40 MG tablet Take 40 mg by mouth daily.  . metoprolol tartrate (LOPRESSOR) 25 MG tablet Take 1 tablet (25 mg total) by mouth 2 (two) times daily.  . potassium chloride SA (KLOR-CON) 20 MEQ tablet Take 20 mEq by mouth daily.  . [DISCONTINUED] furosemide (LASIX) 40 MG tablet Take 1 tablet (40 mg total) by mouth daily for 30 days.     Allergies:   Patient has no known allergies.   Social History   Tobacco Use  . Smoking status: Never Smoker  . Smokeless tobacco: Never Used  Substance Use Topics  . Alcohol use: No  . Drug use: No     Family Hx: The patient's family history includes Hypertension  in her sister. There is no history of Anesthesia problems, Hypotension, Malignant hyperthermia, or Pseudochol deficiency.  ROS:   Please see the history of present illness.     All other systems reviewed and are negative.   Prior CV studies:   The following studies were reviewed today:  Echocardiogram: 03/04/2019 IMPRESSIONS    1. The left ventricle has normal systolic function, with an ejection fraction of 55-60%. The cavity size was normal. Left ventricular diastolic parameters were normal.  2. The right ventricle has normal systolic  function. The cavity was normal. There is no increase in right ventricular wall thickness.  3. The mitral valve is degenerative. Moderate thickening of the mitral valve leaflet. Moderate calcification of the mitral valve leaflet. There is mild to moderate mitral annular calcification present.  4. The aortic valve is tricuspid. Moderate thickening of the aortic valve. Sclerosis without any evidence of stenosis of the aortic valve. Aortic valve regurgitation is mild by color flow Doppler.  5. The inferior vena cava was dilated in size with >50% respiratory variability.  CT Chest: 04/2019 IMPRESSION: 1. Borderline mild cardiomegaly and small dependent right pleural effusion. 2. Large hiatal hernia. 3. No acute consolidative airspace disease to suggest a pneumonia. Postinfectious/postinflammatory scarring in the medial segment right middle lobe. 4. Scattered tiny pulmonary nodules, largest 2 mm. No follow-up needed if patient is low-risk (and has no known or suspected primary neoplasm). Non-contrast chest CT can be considered in 12 months if patient is high-risk. This recommendation follows the consensus statement: Guidelines for Management of Incidental Pulmonary Nodules Detected on CT Images:From the Fleischner Society 2017; published online before print (10.1148/radiol.[id number]). 5. Three-vessel coronary atherosclerosis.  Labs/Other Tests and Data Reviewed:    EKG:   AFib rate 89 nonspecific ST changes 03/03/19   Recent Labs: 03/05/2019: BUN 13; Creatinine, Ser 0.96; Hemoglobin 7.6; Platelets 381; Potassium 3.9; Sodium 134   Recent Lipid Panel No results found for: CHOL, TRIG, HDL, CHOLHDL, LDLCALC, LDLDIRECT  Wt Readings from Last 3 Encounters:  06/28/19 154 lb (69.9 kg)  04/01/19 150 lb (68 kg)  03/15/14 160 lb (72.6 kg)     Objective:    Vital Signs:  BP 120/84   Pulse 60   Temp 97.8 F (36.6 C)   Ht _0  (1.575 m)   SpO2 95%   BMI 28.17 kg/m    Affect  appropriate Healthy:  appears stated age HEENT: normal Neck supple with no adenopathy JVP normal no bruits no thyromegaly Lungs clear with no wheezing and good diaphragmatic motion Heart:  S1/S2 SEM  murmur, no rub, gallop or click PMI normal Abdomen: benighn, BS positve, no tenderness, no AAA no bruit.  No HSM or HJR Distal pulses intact with no bruits No edema Neuro non-focal Skin warm and dry Post bilateral THR;s  . ASSESSMENT & PLAN:    1. Paroxysmal Atrial Fibrillation - not on anticoagulation due to anemia and failure to have GI evaluation CHADVASC 2.  In NSR observe .  2. Lower Extremity Edema - dependant continue diuretic   3. Anemia - Hct 38.8 on October 2020 stable    4. Pulmonary Nodules -  04/19/19 Chest CT showed scattered tiny pulmonary nodules, largest 2 mm with follow-up non-contrast chest CT recommended in 12 months per primary care physician   COVID-19 Education: The signs and symptoms of COVID-19 were discussed with the patient and how to seek care for testing (follow up with PCP or arrange E-visit). The importance of social distancing was  discussed today.   Medication Adjustments/Labs and Tests Ordered: Current medicines are reviewed at length with the patient today.  Concerns regarding medicines are outlined above.   Tests Ordered:  None   Medication Changes: No orders of the defined types were placed in this encounter.   Follow Up: in a year   Signed, Jenkins Rouge, MD  01/13/2020 11:48 AM    [place]

## 2020-01-13 ENCOUNTER — Other Ambulatory Visit: Payer: Self-pay

## 2020-01-13 ENCOUNTER — Ambulatory Visit: Payer: Medicare Other | Admitting: Cardiovascular Disease

## 2020-01-13 ENCOUNTER — Encounter: Payer: Self-pay | Admitting: Cardiovascular Disease

## 2020-01-13 VITALS — BP 120/84 | HR 60 | Temp 97.8°F | Ht 62.0 in | Wt 170.0 lb

## 2020-01-13 DIAGNOSIS — I480 Paroxysmal atrial fibrillation: Secondary | ICD-10-CM | POA: Diagnosis not present

## 2020-01-13 NOTE — Patient Instructions (Signed)

## 2020-01-24 DIAGNOSIS — D500 Iron deficiency anemia secondary to blood loss (chronic): Secondary | ICD-10-CM | POA: Diagnosis not present

## 2020-01-24 DIAGNOSIS — R7303 Prediabetes: Secondary | ICD-10-CM | POA: Diagnosis not present

## 2020-01-24 DIAGNOSIS — D509 Iron deficiency anemia, unspecified: Secondary | ICD-10-CM | POA: Diagnosis not present

## 2020-01-24 DIAGNOSIS — I4811 Longstanding persistent atrial fibrillation: Secondary | ICD-10-CM | POA: Diagnosis not present

## 2020-01-24 DIAGNOSIS — I5031 Acute diastolic (congestive) heart failure: Secondary | ICD-10-CM | POA: Diagnosis not present

## 2020-01-24 DIAGNOSIS — I480 Paroxysmal atrial fibrillation: Secondary | ICD-10-CM | POA: Diagnosis not present

## 2020-02-06 DIAGNOSIS — R944 Abnormal results of kidney function studies: Secondary | ICD-10-CM | POA: Diagnosis not present

## 2020-02-06 DIAGNOSIS — R600 Localized edema: Secondary | ICD-10-CM | POA: Diagnosis not present

## 2020-02-06 DIAGNOSIS — I480 Paroxysmal atrial fibrillation: Secondary | ICD-10-CM | POA: Diagnosis not present

## 2020-02-06 DIAGNOSIS — D500 Iron deficiency anemia secondary to blood loss (chronic): Secondary | ICD-10-CM | POA: Diagnosis not present

## 2020-02-06 DIAGNOSIS — R918 Other nonspecific abnormal finding of lung field: Secondary | ICD-10-CM | POA: Diagnosis not present

## 2020-06-28 ENCOUNTER — Other Ambulatory Visit: Payer: Self-pay | Admitting: Student

## 2020-06-28 NOTE — Telephone Encounter (Signed)
This is a [facility] pt.  °

## 2020-07-30 DIAGNOSIS — R030 Elevated blood-pressure reading, without diagnosis of hypertension: Secondary | ICD-10-CM | POA: Diagnosis not present

## 2020-07-30 DIAGNOSIS — I5031 Acute diastolic (congestive) heart failure: Secondary | ICD-10-CM | POA: Diagnosis not present

## 2020-07-30 DIAGNOSIS — R600 Localized edema: Secondary | ICD-10-CM | POA: Diagnosis not present

## 2020-07-30 DIAGNOSIS — I4811 Longstanding persistent atrial fibrillation: Secondary | ICD-10-CM | POA: Diagnosis not present

## 2020-07-30 DIAGNOSIS — R7303 Prediabetes: Secondary | ICD-10-CM | POA: Diagnosis not present

## 2020-07-30 DIAGNOSIS — R739 Hyperglycemia, unspecified: Secondary | ICD-10-CM | POA: Diagnosis not present

## 2020-08-06 DIAGNOSIS — D500 Iron deficiency anemia secondary to blood loss (chronic): Secondary | ICD-10-CM | POA: Diagnosis not present

## 2020-08-06 DIAGNOSIS — R944 Abnormal results of kidney function studies: Secondary | ICD-10-CM | POA: Diagnosis not present

## 2020-08-06 DIAGNOSIS — R918 Other nonspecific abnormal finding of lung field: Secondary | ICD-10-CM | POA: Diagnosis not present

## 2020-08-06 DIAGNOSIS — I480 Paroxysmal atrial fibrillation: Secondary | ICD-10-CM | POA: Diagnosis not present

## 2020-08-06 DIAGNOSIS — Z0001 Encounter for general adult medical examination with abnormal findings: Secondary | ICD-10-CM | POA: Diagnosis not present

## 2020-08-06 DIAGNOSIS — R600 Localized edema: Secondary | ICD-10-CM | POA: Diagnosis not present

## 2020-11-20 IMAGING — DX CHEST - 2 VIEW
2 series · 2 of 2 positions shown · non-contrast
Comparison: Chest x-ray dated 03/15/2014.

CLINICAL DATA: Dyspnea.

EXAM:
CHEST - 2 VIEW

[chest pa]
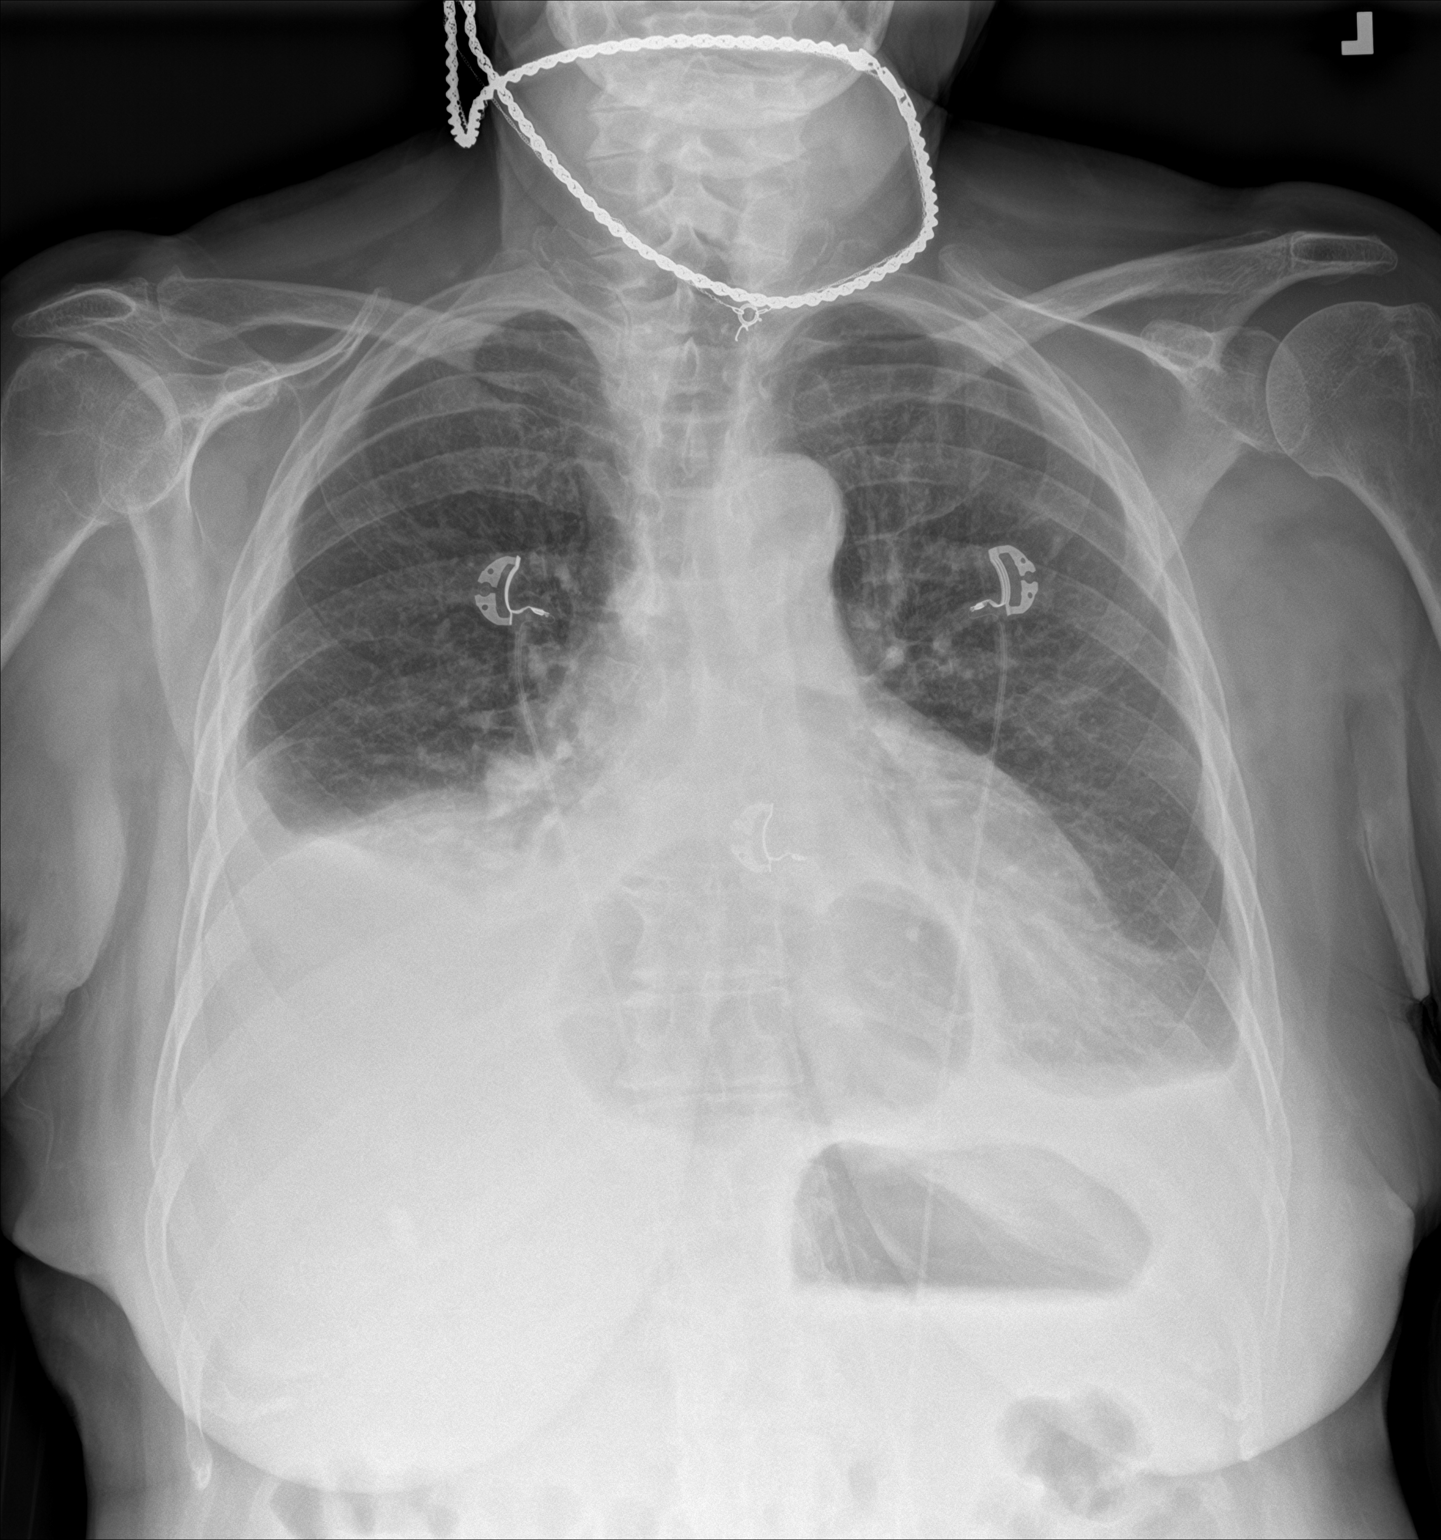

[chest lat]
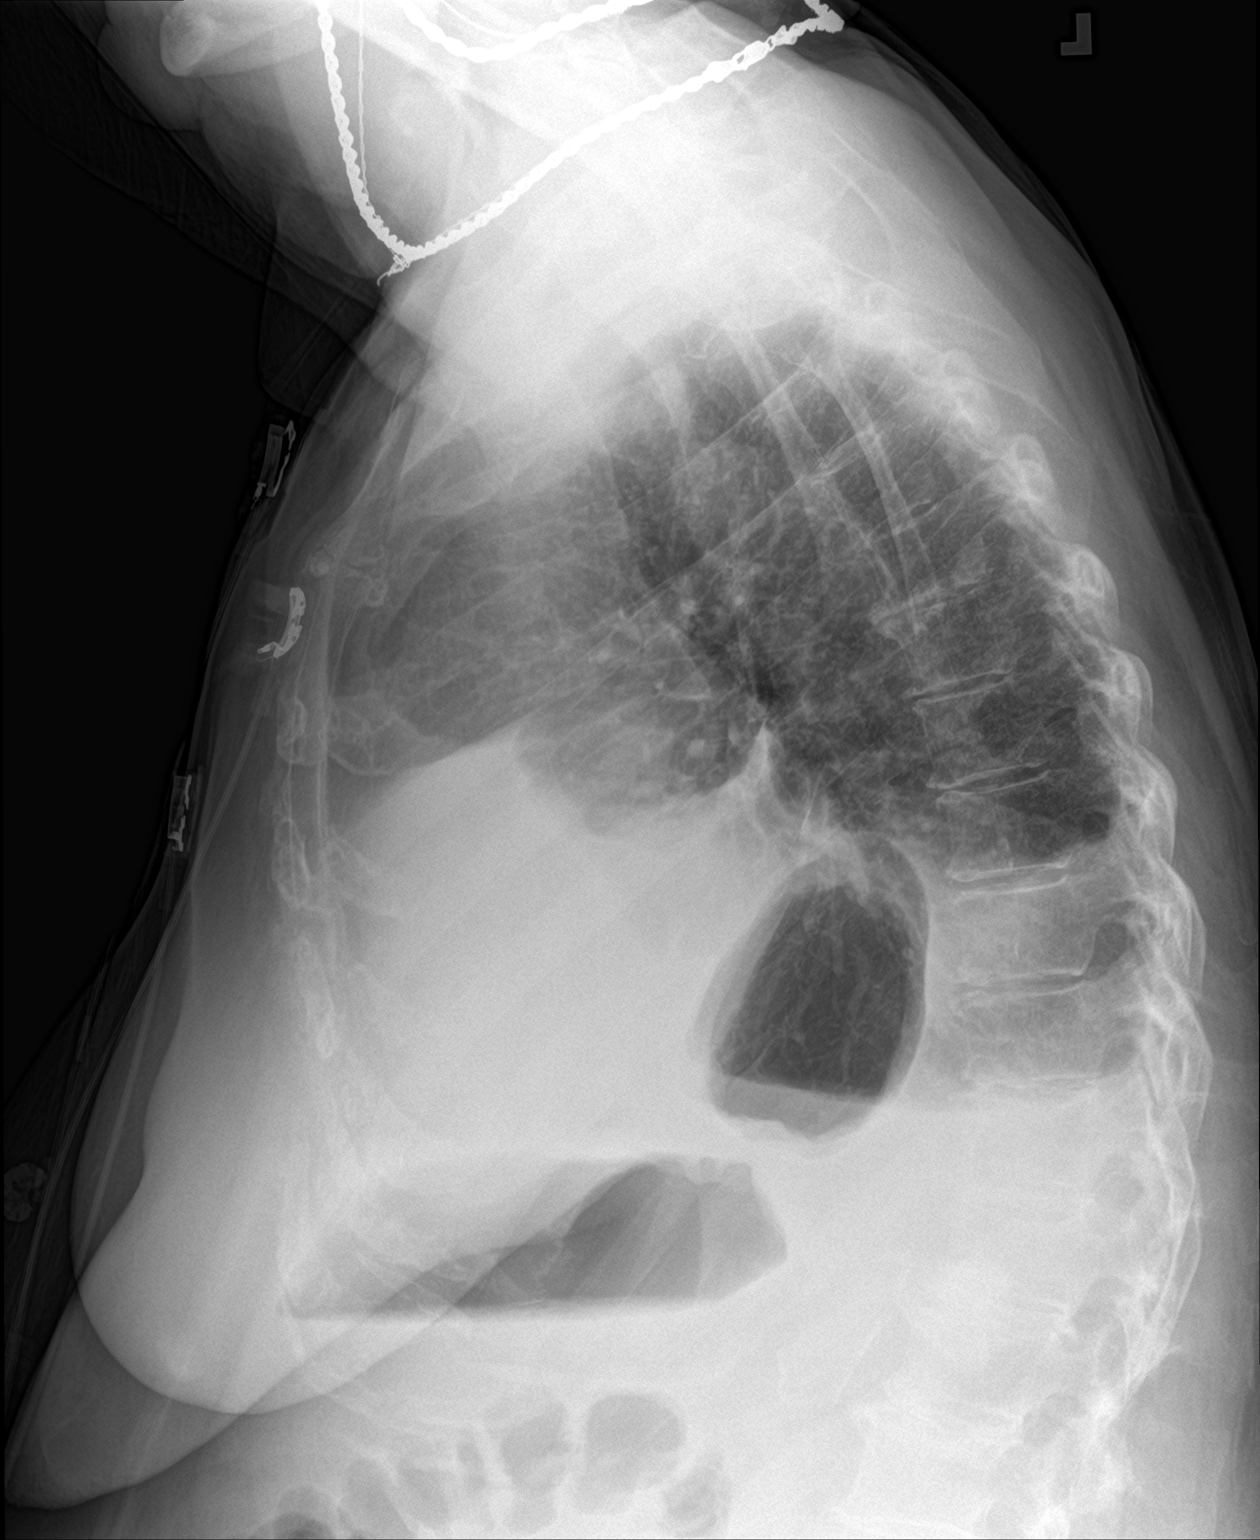

[2 of 2 positions shown; findings below may reference images not displayed]

FINDINGS: Borderline cardiomegaly. Hiatal hernia, moderate to large in size.

Dense opacity at the RIGHT lung base, at least some component
representing pleural effusion. Small LEFT pleural effusion. No
pneumothorax seen. Osseous structures about the chest are
unremarkable.
IMPRESSION: 1. Dense opacity at the RIGHT lung base. At least some component of
this opacity is due to pleural effusion, likely moderate in size.
Suspect some degree of associated atelectasis. Superimposed
pneumonia cannot be excluded.
2. Small LEFT pleural effusion.
3. Borderline cardiomegaly.
4. Hiatal hernia, moderate to large in size.

## 2021-01-04 IMAGING — CT CT CHEST WITHOUT CONTRAST
2 of 4 series · 15 of 36 positions shown, 18 images · non-contrast
Comparison: 03/05/2019 chest radiograph.

CLINICAL DATA: Follow-up pneumonia.

EXAM:
CT CHEST WITHOUT CONTRAST
TECHNIQUE: Multidetector CT imaging of the chest was performed following the
standard protocol without IV contrast.

[Series 2: routine chest without · axial · non-contrast · 0.62mm/px · z∈[+66,+322]mm · 12 of 152 slices shown, 15 images]
[im 12/152  mediastinal]
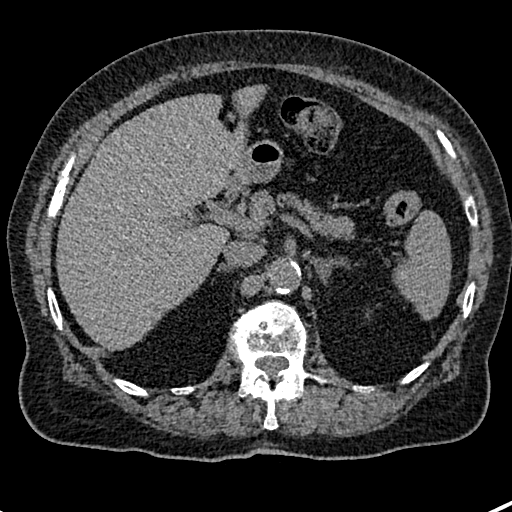
[im 12/152  lung]
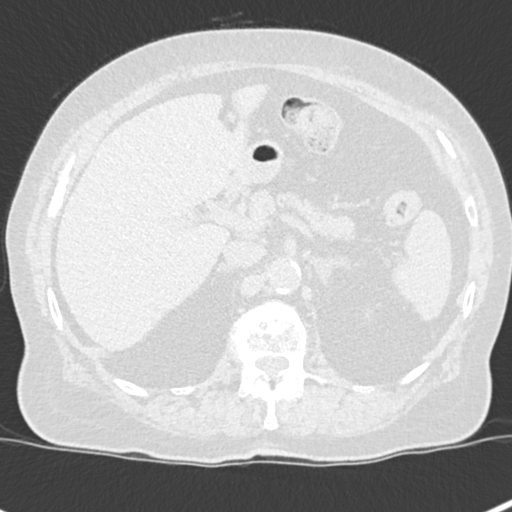
[im 24/152  lung]
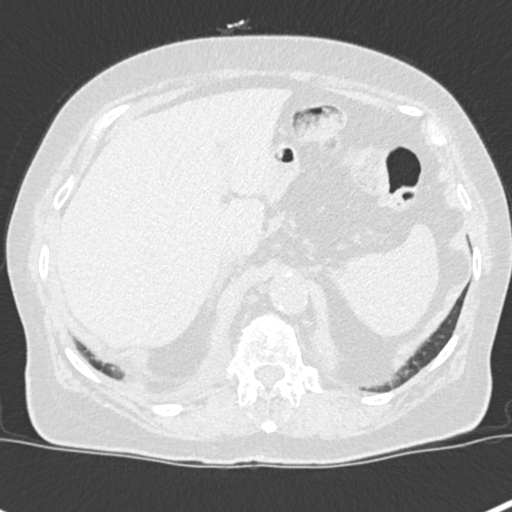
[im 35/152  lung]
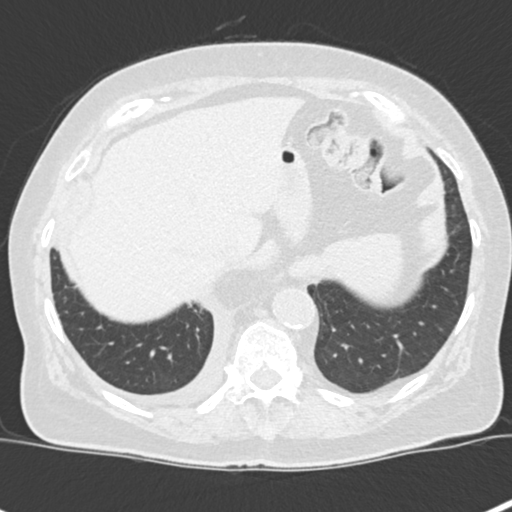
[im 47/152  lung]
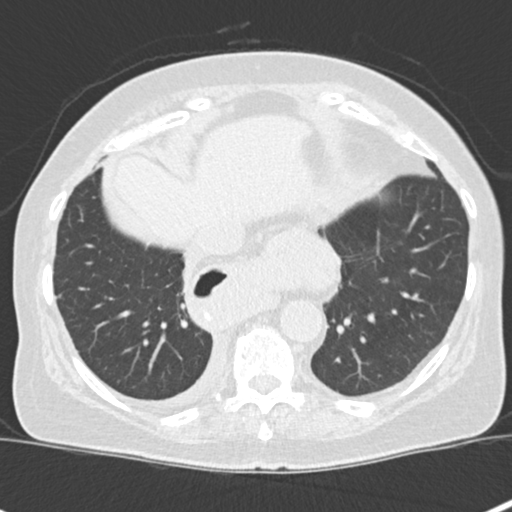
[im 59/152  mediastinal]
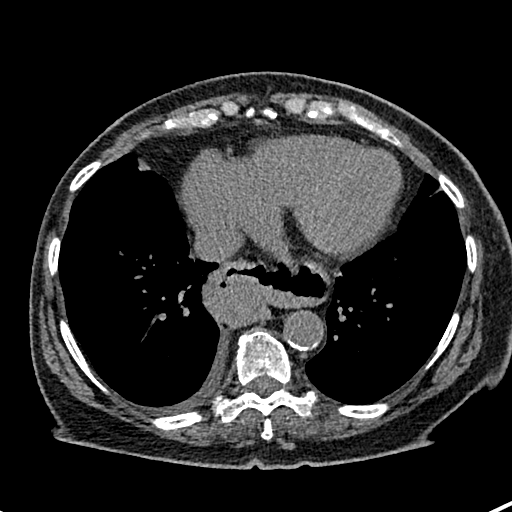
[im 59/152  lung]
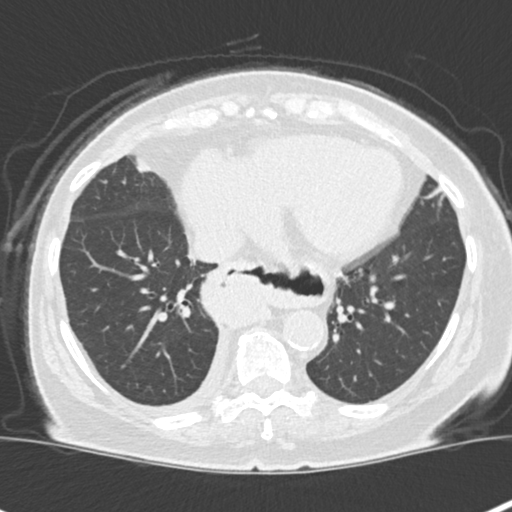
[im 70/152  lung]
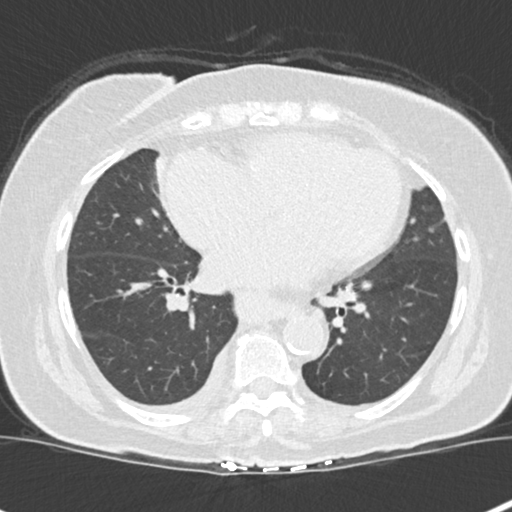
[im 82/152  lung]
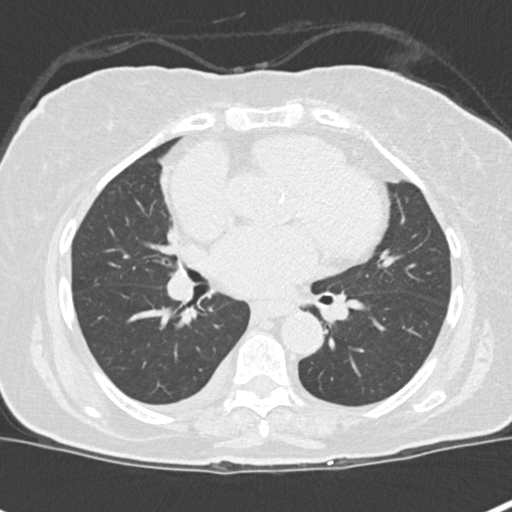
[im 93/152  lung]
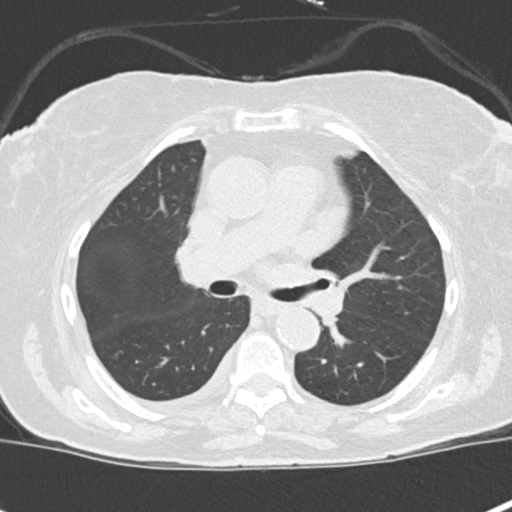
[im 105/152  mediastinal]
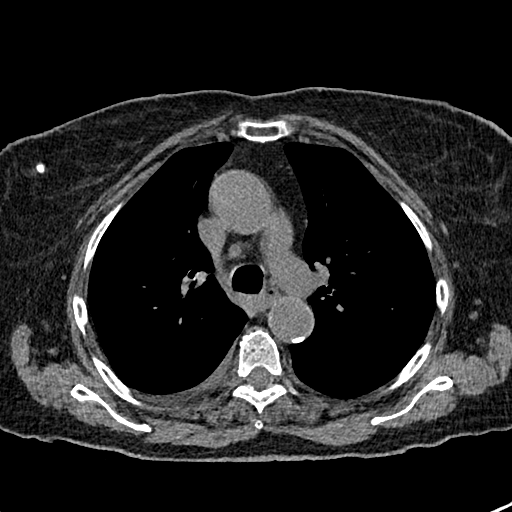
[im 105/152  lung]
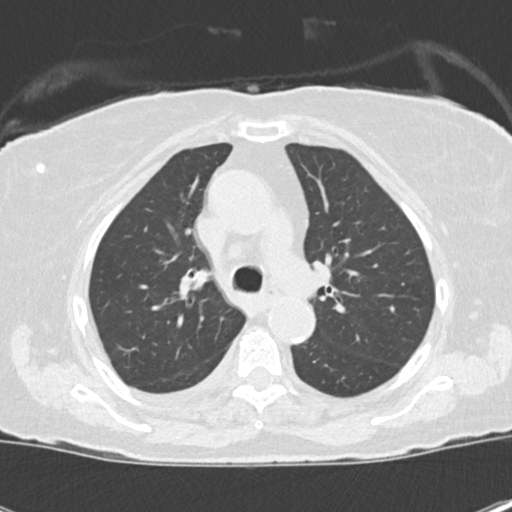
[im 117/152  lung]
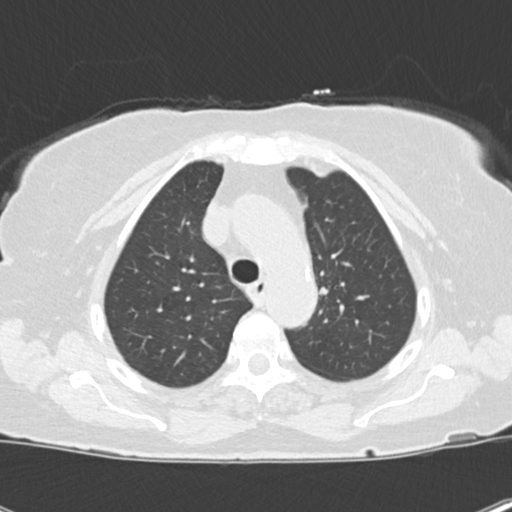
[im 128/152  lung]
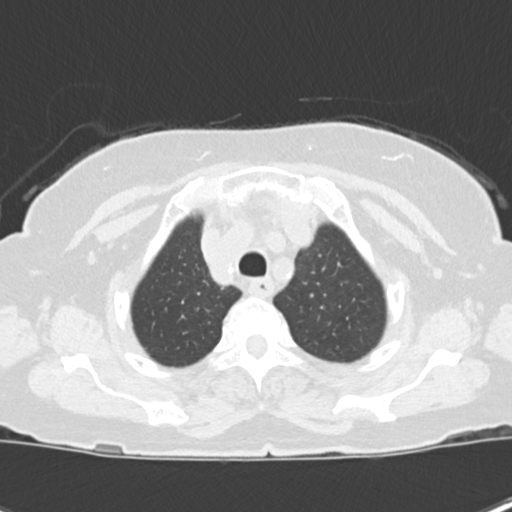
[im 140/152  lung]
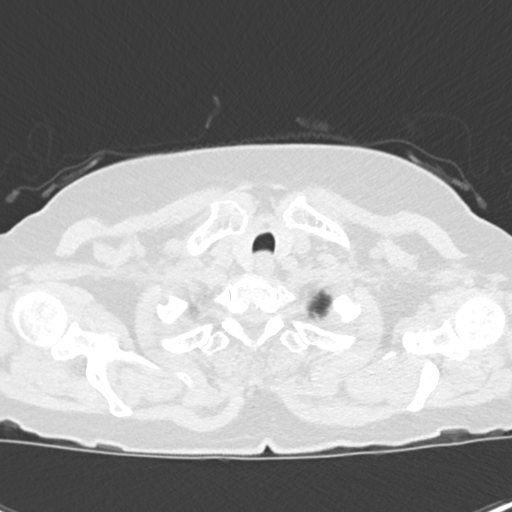

[Series 5: coronal · coronal · 0.59mm/px · 3 of 151 slices shown]
[im 31/151  lung]
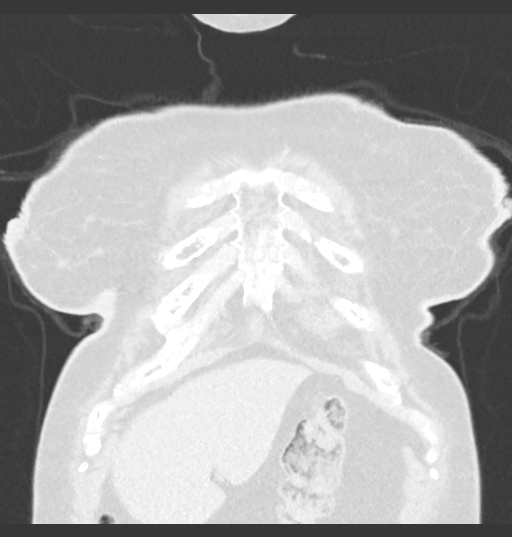
[im 61/151  lung]
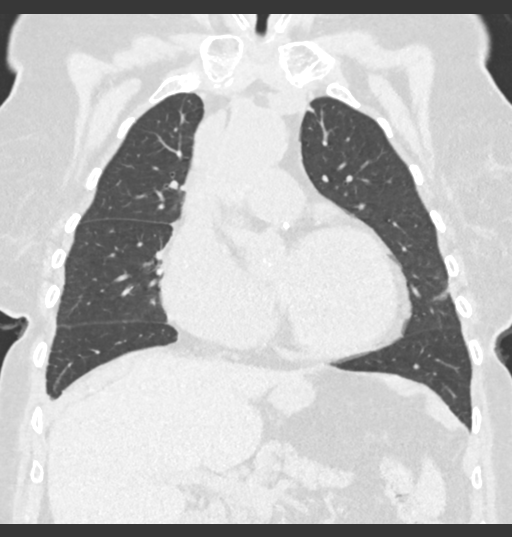
[im 91/151  lung]
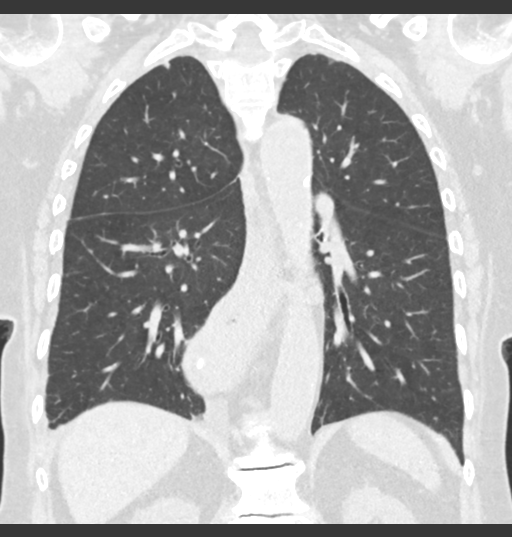

[15 of 36 positions shown; findings below may reference images not displayed]

FINDINGS: Cardiovascular: Borderline mild cardiomegaly. No significant
pericardial effusion/thickening. Three-vessel coronary
atherosclerosis. Atherosclerotic nonaneurysmal thoracic aorta.
Normal caliber pulmonary arteries.

Mediastinum/Nodes: No discrete thyroid nodules. Unremarkable
esophagus. No pathologically enlarged axillary, mediastinal or hilar
lymph nodes, noting limited sensitivity for the detection of hilar
adenopathy on this noncontrast study.

Lungs/Pleura: No pneumothorax. Small dependent right pleural
effusion. No left pleural effusion. No central airway stenoses. No
acute consolidative airspace disease or lung masses. Parenchymal
band in medial segment right middle lobe. Mild compressive
atelectasis from the hiatal hernia in the medial lower lobes
bilaterally. A few scattered tiny solid pulmonary nodules in both
lungs, largest 2 mm in the left lower lobe (series 4/image 122).

Upper abdomen: Large hiatal hernia.

Musculoskeletal: No aggressive appearing focal osseous lesions.
Marked thoracic spondylosis.
IMPRESSION: 1. Borderline mild cardiomegaly and small dependent right pleural
effusion.
2. Large hiatal hernia.
3. No acute consolidative airspace disease to suggest a pneumonia.
Postinfectious/postinflammatory scarring in the medial segment right
middle lobe.
4. Scattered tiny pulmonary nodules, largest 2 mm. No follow-up
needed if patient is low-risk (and has no known or suspected primary
neoplasm). Non-contrast chest CT can be considered in 12 months if
patient is high-risk. This recommendation follows the consensus
statement: Guidelines for Management of Incidental Pulmonary Nodules
Detected on CT Images:From the [HOSPITAL] 9564; published
online before print (10.1148/radiol.[id number]).
5. Three-vessel coronary atherosclerosis.

Aortic Atherosclerosis ([id number]).

## 2021-01-30 DIAGNOSIS — R030 Elevated blood-pressure reading, without diagnosis of hypertension: Secondary | ICD-10-CM | POA: Diagnosis not present

## 2021-01-30 DIAGNOSIS — D509 Iron deficiency anemia, unspecified: Secondary | ICD-10-CM | POA: Diagnosis not present

## 2021-01-30 DIAGNOSIS — R944 Abnormal results of kidney function studies: Secondary | ICD-10-CM | POA: Diagnosis not present

## 2021-01-30 DIAGNOSIS — D500 Iron deficiency anemia secondary to blood loss (chronic): Secondary | ICD-10-CM | POA: Diagnosis not present

## 2021-01-30 DIAGNOSIS — R739 Hyperglycemia, unspecified: Secondary | ICD-10-CM | POA: Diagnosis not present

## 2021-01-30 DIAGNOSIS — R7303 Prediabetes: Secondary | ICD-10-CM | POA: Diagnosis not present

## 2021-02-04 DIAGNOSIS — D500 Iron deficiency anemia secondary to blood loss (chronic): Secondary | ICD-10-CM | POA: Diagnosis not present

## 2021-02-04 DIAGNOSIS — R600 Localized edema: Secondary | ICD-10-CM | POA: Diagnosis not present

## 2021-02-04 DIAGNOSIS — Z2821 Immunization not carried out because of patient refusal: Secondary | ICD-10-CM | POA: Diagnosis not present

## 2021-02-04 DIAGNOSIS — Z96643 Presence of artificial hip joint, bilateral: Secondary | ICD-10-CM | POA: Diagnosis not present

## 2021-02-04 DIAGNOSIS — R7303 Prediabetes: Secondary | ICD-10-CM | POA: Diagnosis not present

## 2021-02-04 DIAGNOSIS — R918 Other nonspecific abnormal finding of lung field: Secondary | ICD-10-CM | POA: Diagnosis not present

## 2021-02-04 DIAGNOSIS — I480 Paroxysmal atrial fibrillation: Secondary | ICD-10-CM | POA: Diagnosis not present

## 2021-02-04 DIAGNOSIS — R944 Abnormal results of kidney function studies: Secondary | ICD-10-CM | POA: Diagnosis not present

## 2021-03-19 NOTE — Progress Notes (Signed)
Date:  03/25/2021   ID:  Courtney Proctor, DOB [birth year], MRN [id number]  PCP:  Celene Squibb, MD  Cardiologist:  Jenkins Rouge, MD Electrophysiologist:  None   Evaluation Performed:  Follow-Up Visit   History of Present Illness:    85 y.o. f/u for edema and PAF. History of anemia First seen June 2020 dyspnea due to anemia with Hb 6.3. Transfused and patient left AMA without EGD/colon Noted to have afib during admission not anticoagulated due to anemia Needs f/u chest CT for pulmonary nodules noted on CT 04/19/2019   Echo done 03/04/19 reviewed EF 55-60% MAC mild MR and AV sclerosis and mild AR  . She is adamant about not getting COVID vaccine Doesn't trust it and doesn't get flu shots either   She still refuses to get Vaccine Discussed fact that she is in afib with high CHADVASC score And she does not want to be on anticoagulation Also discussed f/u CT for lung nodules which she defers    Past Medical History:  Diagnosis Date   Arthritis    hip   Early cataracts, bilateral    Heart murmur    rhuematic fever at age 53;slight murmur from this   Hemorrhoids    Joint pain    left hip   Nocturia    Past Surgical History:  Procedure Laterality Date   CATARACT EXTRACTION W/PHACO Right 05/09/2013   Procedure: CATARACT EXTRACTION PHACO AND INTRAOCULAR LENS PLACEMENT ([place]);  Surgeon: Tonny Branch, MD;  Location: [facility];  Service: Ophthalmology;  Laterality: Right;  CDE 28.35   CATARACT EXTRACTION W/PHACO Left 05/19/2013   Procedure: CATARACT EXTRACTION PHACO AND INTRAOCULAR LENS PLACEMENT (IOC);  Surgeon: Tonny Branch, MD;  Location: [facility];  Service: Ophthalmology;  Laterality: Left;  CDE:28.11   TOTAL HIP ARTHROPLASTY  11/12/2011   Procedure: TOTAL HIP ARTHROPLASTY;  Surgeon: Ninetta Lights, MD;  Location: [place];  Service: Orthopedics;  Laterality: Left;  120 MINUTES FOR THIS SURGERY   TOTAL HIP ARTHROPLASTY Right 03/22/2014   Procedure: TOTAL HIP ARTHROPLASTY ANTERIOR APPROACH;  Surgeon:  Ninetta Lights, MD;  Location: [place];  Service: Orthopedics;  Laterality: Right;     Current Meds  Medication Sig   aspirin EC 81 MG tablet Take 81 mg by mouth daily.   furosemide (LASIX) 40 MG tablet Take 40 mg by mouth daily.   metoprolol tartrate (LOPRESSOR) 25 MG tablet TAKE ONE TABLET BY MOUTH TWICE A DAY   potassium chloride SA (KLOR-CON) 20 MEQ tablet Take 20 mEq by mouth daily.     Allergies:   Patient has no known allergies.   Social History   Tobacco Use   Smoking status: Never   Smokeless tobacco: Never  Vaping Use   Vaping Use: Never used  Substance Use Topics   Alcohol use: No   Drug use: No     Family Hx: The patient's family history includes Hypertension in her sister. There is no history of Anesthesia problems, Hypotension, Malignant hyperthermia, or Pseudochol deficiency.  ROS:   Please see the history of present illness.     All other systems reviewed and are negative.   Prior CV studies:   The following studies were reviewed today:  Echocardiogram: 03/04/2019 IMPRESSIONS      1. The left ventricle has normal systolic function, with an ejection fraction of 55-60%. The cavity size was normal. Left ventricular diastolic parameters were normal.  2. The right ventricle has normal systolic function. The cavity was  normal. There is no increase in right ventricular wall thickness.  3. The mitral valve is degenerative. Moderate thickening of the mitral valve leaflet. Moderate calcification of the mitral valve leaflet. There is mild to moderate mitral annular calcification present.  4. The aortic valve is tricuspid. Moderate thickening of the aortic valve. Sclerosis without any evidence of stenosis of the aortic valve. Aortic valve regurgitation is mild by color flow Doppler.  5. The inferior vena cava was dilated in size with >50% respiratory variability.  CT Chest: 04/2019 IMPRESSION: 1. Borderline mild cardiomegaly and small dependent right  pleural effusion. 2. Large hiatal hernia. 3. No acute consolidative airspace disease to suggest a pneumonia. Postinfectious/postinflammatory scarring in the medial segment right middle lobe. 4. Scattered tiny pulmonary nodules, largest 2 mm. No follow-up needed if patient is low-risk (and has no known or suspected primary neoplasm). Non-contrast chest CT can be considered in 12 months if patient is high-risk. This recommendation follows the consensus statement: Guidelines for Management of Incidental Pulmonary Nodules Detected on CT Images:From the Fleischner Society 2017; published online before print (10.1148/radiol.[id number]). 5. Three-vessel coronary atherosclerosis.  Labs/Other Tests and Data Reviewed:    EKG:   AFib rate 89 nonspecific ST changes 03/03/19   Recent Labs: No results found for requested labs within last 8760 hours.   Recent Lipid Panel No results found for: CHOL, TRIG, HDL, CHOLHDL, LDLCALC, LDLDIRECT  Wt Readings from Last 3 Encounters:  03/25/21 72.1 kg  01/13/20 77.1 kg  06/28/19 69.9 kg     Objective:    Vital Signs:  BP 136/80   Pulse 62   Ht _0  (1.549 m)   Wt 72.1 kg   SpO2 98%   BMI 30.04 kg/m    Affect appropriate Healthy:  appears stated age HEENT: normal Neck supple with no adenopathy JVP normal no bruits no thyromegaly Lungs clear with no wheezing and good diaphragmatic motion Heart:  S1/S2 SEM  murmur, no rub, gallop or click PMI normal Abdomen: benighn, BS positve, no tenderness, no AAA no bruit.  No HSM or HJR Distal pulses intact with no bruits No edema Neuro non-focal Skin warm and dry Post bilateral THR;s  . ASSESSMENT & PLAN:    1. Paroxysmal Atrial Fibrillation - not on anticoagulation due to anemia and failure to have GI evaluation CHADVASC 3   She also refuses to start DOAC or other blood thinner IRate control is fine  2. Lower Extremity Edema - dependant continue diuretic   3. Anemia - Hct 39.4 on 11/2020   stable    4. Pulmonary Nodules -  04/19/19 Chest CT showed scattered tiny pulmonary nodules, largest 2 mm with follow-up non-contrast chest CT recommended in 12 months per primary care physician She refuses to have f/u studay   COVID-19 Education: The signs and symptoms of COVID-19 were discussed with the patient and how to seek care for testing (follow up with PCP or arrange E-visit). The importance of social distancing was discussed today.   Medication Adjustments/Labs and Tests Ordered: Current medicines are reviewed at length with the patient today.  Concerns regarding medicines are outlined above.   Tests Ordered:  None   Medication Changes: No orders of the defined types were placed in this encounter.   Follow Up: in a year   Signed, Jenkins Rouge, MD  03/25/2021 10:03 AM    [place]

## 2021-03-25 ENCOUNTER — Ambulatory Visit: Payer: Medicare Other | Admitting: Cardiovascular Disease

## 2021-03-25 ENCOUNTER — Other Ambulatory Visit: Payer: Self-pay

## 2021-03-25 ENCOUNTER — Encounter: Payer: Self-pay | Admitting: Cardiovascular Disease

## 2021-03-25 VITALS — BP 136/80 | HR 62 | Ht 61.0 in | Wt 159.0 lb

## 2021-03-25 DIAGNOSIS — R911 Solitary pulmonary nodule: Secondary | ICD-10-CM

## 2021-03-25 DIAGNOSIS — I480 Paroxysmal atrial fibrillation: Secondary | ICD-10-CM | POA: Diagnosis not present

## 2021-03-25 NOTE — Patient Instructions (Signed)
Medication Instructions:  Your physician recommends that you continue on your current medications as directed. Please refer to the Current Medication list given to you today.  *If you need a refill on your cardiac medications before your next appointment, please call your pharmacy*   Lab Work: NONE   If you have labs (blood work) drawn today and your tests are completely normal, you will receive your results only by: . MyChart Message (if you have MyChart) OR . A paper copy in the mail If you have any lab test that is abnormal or we need to change your treatment, we will call you to review the results.   Testing/Procedures: NONE    Follow-Up: At [facility], you and your health needs are our priority.  As part of our continuing mission to provide you with exceptional heart care, we have created designated Provider Care Teams.  These Care Teams include your primary Cardiologist (physician) and Advanced Practice Providers (APPs -  Physician Assistants and Nurse Practitioners) who all work together to provide you with the care you need, when you need it.  We recommend signing up for the patient portal called "MyChart".  Sign up information is provided on this After Visit Summary.  MyChart is used to connect with patients for Virtual Visits (Telemedicine).  Patients are able to view lab/test results, encounter notes, upcoming appointments, etc.  Non-urgent messages can be sent to your provider as well.   To learn more about what you can do with MyChart, go to [email or web address].    Your next appointment:   1 year(s)  The format for your next appointment:   In Person  Provider:   Peter Nishan, MD   Other Instructions Thank you for choosing [facility]!    

## 2021-08-07 ENCOUNTER — Other Ambulatory Visit: Payer: Self-pay | Admitting: Cardiovascular Disease

## 2021-08-08 DIAGNOSIS — Z0000 Encounter for general adult medical examination without abnormal findings: Secondary | ICD-10-CM | POA: Diagnosis not present

## 2021-08-08 DIAGNOSIS — I10 Essential (primary) hypertension: Secondary | ICD-10-CM | POA: Diagnosis not present

## 2021-08-09 DIAGNOSIS — I10 Essential (primary) hypertension: Secondary | ICD-10-CM | POA: Diagnosis not present

## 2021-08-12 DIAGNOSIS — D509 Iron deficiency anemia, unspecified: Secondary | ICD-10-CM | POA: Diagnosis not present

## 2021-08-12 DIAGNOSIS — Z96643 Presence of artificial hip joint, bilateral: Secondary | ICD-10-CM | POA: Diagnosis not present

## 2021-08-12 DIAGNOSIS — R944 Abnormal results of kidney function studies: Secondary | ICD-10-CM | POA: Diagnosis not present

## 2021-08-12 DIAGNOSIS — I480 Paroxysmal atrial fibrillation: Secondary | ICD-10-CM | POA: Diagnosis not present

## 2021-08-12 DIAGNOSIS — R918 Other nonspecific abnormal finding of lung field: Secondary | ICD-10-CM | POA: Diagnosis not present

## 2021-08-12 DIAGNOSIS — R600 Localized edema: Secondary | ICD-10-CM | POA: Diagnosis not present

## 2021-08-12 DIAGNOSIS — I10 Essential (primary) hypertension: Secondary | ICD-10-CM | POA: Diagnosis not present

## 2021-08-12 DIAGNOSIS — R7303 Prediabetes: Secondary | ICD-10-CM | POA: Diagnosis not present

## 2022-02-06 DIAGNOSIS — R7303 Prediabetes: Secondary | ICD-10-CM | POA: Diagnosis not present

## 2022-02-06 DIAGNOSIS — I10 Essential (primary) hypertension: Secondary | ICD-10-CM | POA: Diagnosis not present

## 2022-02-06 DIAGNOSIS — I480 Paroxysmal atrial fibrillation: Secondary | ICD-10-CM | POA: Diagnosis not present

## 2022-02-12 DIAGNOSIS — I10 Essential (primary) hypertension: Secondary | ICD-10-CM | POA: Diagnosis not present

## 2022-02-12 DIAGNOSIS — R7303 Prediabetes: Secondary | ICD-10-CM | POA: Diagnosis not present

## 2022-02-12 DIAGNOSIS — R918 Other nonspecific abnormal finding of lung field: Secondary | ICD-10-CM | POA: Diagnosis not present

## 2022-02-12 DIAGNOSIS — D509 Iron deficiency anemia, unspecified: Secondary | ICD-10-CM | POA: Diagnosis not present

## 2022-02-12 DIAGNOSIS — Z96643 Presence of artificial hip joint, bilateral: Secondary | ICD-10-CM | POA: Diagnosis not present

## 2022-02-12 DIAGNOSIS — R600 Localized edema: Secondary | ICD-10-CM | POA: Diagnosis not present

## 2022-02-12 DIAGNOSIS — N1831 Chronic kidney disease, stage 3a: Secondary | ICD-10-CM | POA: Diagnosis not present

## 2022-02-12 DIAGNOSIS — R809 Proteinuria, unspecified: Secondary | ICD-10-CM | POA: Diagnosis not present

## 2022-02-12 DIAGNOSIS — I480 Paroxysmal atrial fibrillation: Secondary | ICD-10-CM | POA: Diagnosis not present

## 2022-04-09 NOTE — Progress Notes (Signed)
Date:  04/14/2022   ID:  Courtney Proctor, DOB [birth year], MRN [id number]  PCP:  Celene Squibb, MD  Cardiologist:  Jenkins Rouge, MD Electrophysiologist:  None   Evaluation Performed:  Follow-Up Visit   History of Present Illness:    86 y.o. f/u for edema and PAF. History of anemia First seen June 2020 dyspnea due to anemia with Hb 6.3. Transfused and patient left AMA without EGD/colon Noted to have afib during admission not anticoagulated due to anemia Needs f/u chest CT for pulmonary nodules noted on CT 04/19/2019   Echo done 03/04/19 reviewed EF 55-60% MAC mild MR and AV sclerosis and mild AR  . She is adamant about not getting COVID vaccine Doesn't trust it and doesn't get flu shots either   Discussed fact that she is in afib with high CHADVASC score And she does not want to be on anticoagulation Also discussed f/u CT for lung nodules which she defers   Doing well drives does her own shopping Lives with son and daughter/grandson within 2 blocks No TIA symptoms and still refuses anticoagulation HR low today discussed cutting back lopressor Had blood work with primary 3-4 weeks ago    Past Medical History:  Diagnosis Date   Arthritis    hip   Early cataracts, bilateral    Heart murmur    rhuematic fever at age 19;slight murmur from this   Hemorrhoids    Joint pain    left hip   Nocturia    Past Surgical History:  Procedure Laterality Date   CATARACT EXTRACTION W/PHACO Right 05/09/2013   Procedure: CATARACT EXTRACTION PHACO AND INTRAOCULAR LENS PLACEMENT ([place]);  Surgeon: Tonny Branch, MD;  Location: [facility];  Service: Ophthalmology;  Laterality: Right;  CDE 28.35   CATARACT EXTRACTION W/PHACO Left 05/19/2013   Procedure: CATARACT EXTRACTION PHACO AND INTRAOCULAR LENS PLACEMENT ([facility]);  Surgeon: Tonny Branch, MD;  Location: [facility];  Service: Ophthalmology;  Laterality: Left;  CDE:28.11   TOTAL HIP ARTHROPLASTY  11/12/2011   Procedure: TOTAL HIP ARTHROPLASTY;  Surgeon: Ninetta Lights,  MD;  Location: [place];  Service: Orthopedics;  Laterality: Left;  120 MINUTES FOR THIS SURGERY   TOTAL HIP ARTHROPLASTY Right 03/22/2014   Procedure: TOTAL HIP ARTHROPLASTY ANTERIOR APPROACH;  Surgeon: Ninetta Lights, MD;  Location: [place];  Service: Orthopedics;  Laterality: Right;     Current Meds  Medication Sig   aspirin EC 81 MG tablet Take 81 mg by mouth daily.   Ferrous Sulfate (IRON) 325 (65 Fe) MG TABS Take by mouth.   furosemide (LASIX) 40 MG tablet Take 40 mg by mouth daily.   metoprolol tartrate (LOPRESSOR) 25 MG tablet TAKE ONE TABLET BY MOUTH TWICE A DAY   potassium chloride SA (KLOR-CON) 20 MEQ tablet Take 20 mEq by mouth daily.     Allergies:   Patient has no known allergies.   Social History   Tobacco Use   Smoking status: Never   Smokeless tobacco: Never  Vaping Use   Vaping Use: Never used  Substance Use Topics   Alcohol use: No   Drug use: No     Family Hx: The patient's family history includes Hypertension in her sister. There is no history of Anesthesia problems, Hypotension, Malignant hyperthermia, or Pseudochol deficiency.  ROS:   Please see the history of present illness.     All other systems reviewed and are negative.   Prior CV studies:   The following studies were reviewed today:  Echocardiogram: 03/04/2019 IMPRESSIONS      1. The left ventricle has normal systolic function, with an ejection fraction of 55-60%. The cavity size was normal. Left ventricular diastolic parameters were normal.  2. The right ventricle has normal systolic function. The cavity was normal. There is no increase in right ventricular wall thickness.  3. The mitral valve is degenerative. Moderate thickening of the mitral valve leaflet. Moderate calcification of the mitral valve leaflet. There is mild to moderate mitral annular calcification present.  4. The aortic valve is tricuspid. Moderate thickening of the aortic valve. Sclerosis without any evidence of stenosis of the  aortic valve. Aortic valve regurgitation is mild by color flow Doppler.  5. The inferior vena cava was dilated in size with >50% respiratory variability.  CT Chest: 04/2019 IMPRESSION: 1. Borderline mild cardiomegaly and small dependent right pleural effusion. 2. Large hiatal hernia. 3. No acute consolidative airspace disease to suggest a pneumonia. Postinfectious/postinflammatory scarring in the medial segment right middle lobe. 4. Scattered tiny pulmonary nodules, largest 2 mm. No follow-up needed if patient is low-risk (and has no known or suspected primary neoplasm). Non-contrast chest CT can be considered in 12 months if patient is high-risk. This recommendation follows the consensus statement: Guidelines for Management of Incidental Pulmonary Nodules Detected on CT Images:From the Fleischner Society 2017; published online before print (10.1148/radiol.[id number]). 5. Three-vessel coronary atherosclerosis.  Labs/Other Tests and Data Reviewed:    EKG:   afib rate 54 nonspecific ST changes   Recent Labs: No results found for requested labs within last 365 days.   Recent Lipid Panel No results found for: "CHOL", "TRIG", "HDL", "CHOLHDL", "LDLCALC", "LDLDIRECT"  Wt Readings from Last 3 Encounters:  04/14/22 163 lb 12.8 oz (74.3 kg)  03/25/21 159 lb (72.1 kg)  01/13/20 170 lb (77.1 kg)     Objective:    Vital Signs:  BP (!) 152/80   Pulse (!) 54   Wt 163 lb 12.8 oz (74.3 kg)   SpO2 96%   BMI 30.95 kg/m    Affect appropriate Healthy:  appears stated age HEENT: normal Neck supple with no adenopathy JVP normal no bruits no thyromegaly Lungs clear with no wheezing and good diaphragmatic motion Heart:  S1/S2 SEM  murmur, no rub, gallop or click PMI normal Abdomen: benighn, BS positve, no tenderness, no AAA no bruit.  No HSM or HJR Distal pulses intact with no bruits No edema Neuro non-focal Skin warm and dry Post bilateral THR;s  . ASSESSMENT & PLAN:    1.  Paroxysmal Atrial Fibrillation - not on anticoagulation due to anemia and failure to have GI evaluation CHADVASC 3   She also refuses to start DOAC or other blood thinner Decreased lopressor to 12.5 mg bid  CHADVASC 3   2. Lower Extremity Edema - dependant continue diuretic   3. Anemia - Hct 337.2 01/2022 stable  4. Pulmonary Nodules -  04/19/19 Chest CT showed scattered tiny pulmonary nodules, largest 2 mm with follow-up non-contrast chest CT recommended in 12 months per primary care physician She refuses to have f/u study   COVID-19 Education: The signs and symptoms of COVID-19 were discussed with the patient and how to seek care for testing (follow up with PCP or arrange E-visit). The importance of social distancing was discussed today.   Medication Adjustments/Labs and Tests Ordered: Current medicines are reviewed at length with the patient today.  Concerns regarding medicines are outlined above.   Tests Ordered:  None   Medication Changes: No  orders of the defined types were placed in this encounter.    Follow Up: in a year   Signed, Jenkins Rouge, MD  04/14/2022 8:54 AM    [place]

## 2022-04-14 ENCOUNTER — Encounter: Payer: Self-pay | Admitting: Cardiovascular Disease

## 2022-04-14 ENCOUNTER — Ambulatory Visit: Payer: Medicare Other | Admitting: Cardiovascular Disease

## 2022-04-14 VITALS — BP 152/80 | HR 54 | Wt 163.8 lb

## 2022-04-14 DIAGNOSIS — I480 Paroxysmal atrial fibrillation: Secondary | ICD-10-CM

## 2022-04-14 MED ORDER — METOPROLOL TARTRATE 25 MG PO TABS: 12.5000 mg | ORAL_TABLET | Freq: Two times a day (BID) | ORAL | 3 refills | Status: DC

## 2022-04-14 NOTE — Patient Instructions (Signed)
Medication Instructions:  Your physician has recommended you make the following change in your medication:   Decrease Lopressor to 12.5 mg Two Times Daily   *If you need a refill on your cardiac medications before your next appointment, please call your pharmacy*   Lab Work:  NONE   If you have labs (blood work) drawn today and your tests are completely normal, you will receive your results only by: [place] (if you have MyChart) OR A paper copy in the mail If you have any lab test that is abnormal or we need to change your treatment, we will call you to review the results.   Testing/Procedures: NONE    Follow-Up: At [facility], you and your health needs are our priority.  As part of our continuing mission to provide you with exceptional heart care, we have created designated Provider Care Teams.  These Care Teams include your primary Cardiologist (physician) and Advanced Practice Providers (APPs -  Physician Assistants and Nurse Practitioners) who all work together to provide you with the care you need, when you need it.  We recommend signing up for the patient portal called "MyChart".  Sign up information is provided on this After Visit Summary.  MyChart is used to connect with patients for Virtual Visits (Telemedicine).  Patients are able to view lab/test results, encounter notes, upcoming appointments, etc.  Non-urgent messages can be sent to your provider as well.   To learn more about what you can do with MyChart, go to [email or web address].    Your next appointment:   1 year(s)  The format for your next appointment:   In Person  Provider:   Jenkins Rouge, MD    Other Instructions Thank you for choosing [facility]!    Important Information About [facility]

## 2022-08-14 DIAGNOSIS — R7303 Prediabetes: Secondary | ICD-10-CM | POA: Diagnosis not present

## 2022-08-14 DIAGNOSIS — I480 Paroxysmal atrial fibrillation: Secondary | ICD-10-CM | POA: Diagnosis not present

## 2022-08-14 DIAGNOSIS — D509 Iron deficiency anemia, unspecified: Secondary | ICD-10-CM | POA: Diagnosis not present

## 2022-09-15 DIAGNOSIS — R809 Proteinuria, unspecified: Secondary | ICD-10-CM | POA: Diagnosis not present

## 2022-09-15 DIAGNOSIS — D509 Iron deficiency anemia, unspecified: Secondary | ICD-10-CM | POA: Diagnosis not present

## 2022-09-15 DIAGNOSIS — R7303 Prediabetes: Secondary | ICD-10-CM | POA: Diagnosis not present

## 2022-09-15 DIAGNOSIS — Z2820 Immunization not carried out because of patient decision for unspecified reason: Secondary | ICD-10-CM | POA: Diagnosis not present

## 2022-09-15 DIAGNOSIS — I480 Paroxysmal atrial fibrillation: Secondary | ICD-10-CM | POA: Diagnosis not present

## 2022-09-15 DIAGNOSIS — N1831 Chronic kidney disease, stage 3a: Secondary | ICD-10-CM | POA: Diagnosis not present

## 2022-09-15 DIAGNOSIS — I10 Essential (primary) hypertension: Secondary | ICD-10-CM | POA: Diagnosis not present

## 2022-09-15 DIAGNOSIS — R600 Localized edema: Secondary | ICD-10-CM | POA: Diagnosis not present

## 2022-09-15 DIAGNOSIS — Z96643 Presence of artificial hip joint, bilateral: Secondary | ICD-10-CM | POA: Diagnosis not present

## 2022-09-15 DIAGNOSIS — R918 Other nonspecific abnormal finding of lung field: Secondary | ICD-10-CM | POA: Diagnosis not present

## 2022-09-15 DIAGNOSIS — Z0000 Encounter for general adult medical examination without abnormal findings: Secondary | ICD-10-CM | POA: Diagnosis not present

## 2022-10-08 DIAGNOSIS — I480 Paroxysmal atrial fibrillation: Secondary | ICD-10-CM | POA: Diagnosis not present

## 2022-10-08 DIAGNOSIS — I10 Essential (primary) hypertension: Secondary | ICD-10-CM | POA: Diagnosis not present

## 2022-10-08 DIAGNOSIS — R600 Localized edema: Secondary | ICD-10-CM | POA: Diagnosis not present

## 2022-10-08 DIAGNOSIS — J069 Acute upper respiratory infection, unspecified: Secondary | ICD-10-CM | POA: Diagnosis not present

## 2022-10-21 DIAGNOSIS — J302 Other seasonal allergic rhinitis: Secondary | ICD-10-CM | POA: Diagnosis not present

## 2022-10-21 DIAGNOSIS — J069 Acute upper respiratory infection, unspecified: Secondary | ICD-10-CM | POA: Diagnosis not present

## 2023-03-11 DIAGNOSIS — D509 Iron deficiency anemia, unspecified: Secondary | ICD-10-CM | POA: Diagnosis not present

## 2023-03-11 DIAGNOSIS — R7303 Prediabetes: Secondary | ICD-10-CM | POA: Diagnosis not present

## 2023-03-11 DIAGNOSIS — I480 Paroxysmal atrial fibrillation: Secondary | ICD-10-CM | POA: Diagnosis not present

## 2023-03-13 DIAGNOSIS — Z0000 Encounter for general adult medical examination without abnormal findings: Secondary | ICD-10-CM | POA: Diagnosis not present

## 2023-03-17 DIAGNOSIS — I10 Essential (primary) hypertension: Secondary | ICD-10-CM | POA: Diagnosis not present

## 2023-03-17 DIAGNOSIS — N1831 Chronic kidney disease, stage 3a: Secondary | ICD-10-CM | POA: Diagnosis not present

## 2023-03-17 DIAGNOSIS — Z96643 Presence of artificial hip joint, bilateral: Secondary | ICD-10-CM | POA: Diagnosis not present

## 2023-03-17 DIAGNOSIS — R809 Proteinuria, unspecified: Secondary | ICD-10-CM | POA: Diagnosis not present

## 2023-03-17 DIAGNOSIS — R918 Other nonspecific abnormal finding of lung field: Secondary | ICD-10-CM | POA: Diagnosis not present

## 2023-03-17 DIAGNOSIS — E1122 Type 2 diabetes mellitus with diabetic chronic kidney disease: Secondary | ICD-10-CM | POA: Diagnosis not present

## 2023-03-17 DIAGNOSIS — D509 Iron deficiency anemia, unspecified: Secondary | ICD-10-CM | POA: Diagnosis not present

## 2023-03-17 DIAGNOSIS — R600 Localized edema: Secondary | ICD-10-CM | POA: Diagnosis not present

## 2023-03-17 DIAGNOSIS — I480 Paroxysmal atrial fibrillation: Secondary | ICD-10-CM | POA: Diagnosis not present

## 2023-04-23 ENCOUNTER — Other Ambulatory Visit: Payer: Self-pay | Admitting: Cardiovascular Disease

## 2023-05-26 ENCOUNTER — Other Ambulatory Visit: Payer: Self-pay | Admitting: Cardiovascular Disease

## 2023-08-14 DIAGNOSIS — D509 Iron deficiency anemia, unspecified: Secondary | ICD-10-CM | POA: Diagnosis not present

## 2023-08-14 DIAGNOSIS — I480 Paroxysmal atrial fibrillation: Secondary | ICD-10-CM | POA: Diagnosis not present

## 2023-08-14 DIAGNOSIS — E1122 Type 2 diabetes mellitus with diabetic chronic kidney disease: Secondary | ICD-10-CM | POA: Diagnosis not present

## 2023-08-18 NOTE — Progress Notes (Signed)
Date:  08/24/2023   ID:  Courtney Proctor, DOB Jan 05, 1936, MRN [id number]  PCP:  Benita Stabile, MD  Cardiologist:  Charlton Haws, MD Electrophysiologist:  None   Evaluation Performed:  Follow-Up Visit   History of Present Illness:    87 y.o. f/u for edema and PAF. History of anemia First seen June 2020 dyspnea due to anemia with Hb 6.3. Transfused and patient left AMA without EGD/colon Noted to have afib during admission not anticoagulated due to anemia Needs f/u chest CT for pulmonary nodules noted on CT 04/19/2019   Echo done 03/04/19 reviewed EF 55-60% MAC mild MR and AV sclerosis and mild AR  . She is adamant about not getting COVID vaccine Doesn't trust it and doesn't get flu shots either   Discussed fact that she is in afib with high CHADVASC score And she does not want to be on anticoagulation Also discussed f/u CT for lung nodules which she defers   Doing well drives does her own shopping Lives with son and daughter/grandson within 2 blocks No TIA symptoms and still refuses anticoagulation Beta blocker cut back in 2023 for low pulse  Son has had some issues with lower back pain. Works for [employer] Daughter in Social worker recovered From colon cancer with good prognosis   Past Medical History:  Diagnosis Date   Arthritis    hip   Early cataracts, bilateral    Heart murmur    rhuematic fever at age 33;slight murmur from this   Hemorrhoids    Joint pain    left hip   Nocturia    Past Surgical History:  Procedure Laterality Date   CATARACT EXTRACTION W/PHACO Right 05/09/2013   Procedure: CATARACT EXTRACTION PHACO AND INTRAOCULAR LENS PLACEMENT ([facility]);  Surgeon: Gemma Payor, MD;  Location: [facility];  Service: Ophthalmology;  Laterality: Right;  CDE 28.35   CATARACT EXTRACTION W/PHACO Left 05/19/2013   Procedure: CATARACT EXTRACTION PHACO AND INTRAOCULAR LENS PLACEMENT ([facility]);  Surgeon: Gemma Payor, MD;  Location: [facility];  Service: Ophthalmology;  Laterality: Left;  CDE:28.11   TOTAL  HIP ARTHROPLASTY  11/12/2011   Procedure: TOTAL HIP ARTHROPLASTY;  Surgeon: Loreta Ave, MD;  [facility];  Service: Orthopedics;  Laterality: Left;  120 MINUTES FOR THIS SURGERY   TOTAL HIP ARTHROPLASTY Right 03/22/2014   Procedure: TOTAL HIP ARTHROPLASTY ANTERIOR APPROACH;  Surgeon: Loreta Ave, MD;  Location: [facility];  Service: Orthopedics;  Laterality: Right;     Current Meds  Medication Sig   aspirin EC 81 MG tablet Take 81 mg by mouth daily.   Ferrous Sulfate (IRON) 325 (65 Fe) MG TABS Take by mouth.   furosemide (LASIX) 20 MG tablet Take by mouth.   metoprolol tartrate (LOPRESSOR) 25 MG tablet TAKE 1/2 TABLET (12.5 MG TOTAL) BY MOUTH2 TIMES DAILY   potassium chloride SA (KLOR-CON) 20 MEQ tablet Take 20 mEq by mouth daily.     Allergies:   Patient has no known allergies.   Social History   Tobacco Use   Smoking status: Never   Smokeless tobacco: Never  Vaping Use   Vaping status: Never Used  Substance Use Topics   Alcohol use: No   Drug use: No     Family Hx: The patient's family history includes Hypertension in her sister. There is no history of Anesthesia problems, Hypotension, Malignant hyperthermia, or Pseudochol deficiency.  ROS:   Please see the history of present illness.     All other systems reviewed  and are negative.   Prior CV studies:   The following studies were reviewed today:  Echocardiogram: 03/04/2019 IMPRESSIONS      1. The left ventricle has normal systolic function, with an ejection fraction of 55-60%. The cavity size was normal. Left ventricular diastolic parameters were normal.  2. The right ventricle has normal systolic function. The cavity was normal. There is no increase in right ventricular wall thickness.  3. The mitral valve is degenerative. Moderate thickening of the mitral valve leaflet. Moderate calcification of the mitral valve leaflet. There is mild to moderate mitral annular calcification present.  4. The aortic valve is  tricuspid. Moderate thickening of the aortic valve. Sclerosis without any evidence of stenosis of the aortic valve. Aortic valve regurgitation is mild by color flow Doppler.  5. The inferior vena cava was dilated in size with >50% respiratory variability.  CT Chest: 04/2019 IMPRESSION: 1. Borderline mild cardiomegaly and small dependent right pleural effusion. 2. Large hiatal hernia. 3. No acute consolidative airspace disease to suggest a pneumonia. Postinfectious/postinflammatory scarring in the medial segment right middle lobe. 4. Scattered tiny pulmonary nodules, largest 2 mm. No follow-up needed if patient is low-risk (and has no known or suspected primary neoplasm). Non-contrast chest CT can be considered in 12 months if patient is high-risk. This recommendation follows the consensus statement: Guidelines for Management of Incidental Pulmonary Nodules Detected on CT Images:From the Fleischner Society 2017; published online before print (10.1148/radiol.1610960454). 5. Three-vessel coronary atherosclerosis.  Labs/Other Tests and Data Reviewed:    EKG:  08/24/2023  afib rate 68 nonspecific ST changes   Recent Labs: No results found for requested labs within last 365 days.   Recent Lipid Panel No results found for: "CHOL", "TRIG", "HDL", "CHOLHDL", "LDLCALC", "LDLDIRECT"  Wt Readings from Last 3 Encounters:  08/24/23 165 lb 9.6 oz (75.1 kg)  04/14/22 163 lb 12.8 oz (74.3 kg)  03/25/21 159 lb (72.1 kg)     Objective:    Vital Signs:  BP (!) 140/96   Pulse 71   Ht 5' 1.5" (1.562 m)   Wt 165 lb 9.6 oz (75.1 kg)   SpO2 96%   BMI 30.78 kg/m    Affect appropriate Healthy:  appears stated age HEENT: normal Neck supple with no adenopathy JVP normal no bruits no thyromegaly Lungs clear with no wheezing and good diaphragmatic motion Heart:  S1/S2 SEM  murmur, no rub, gallop or click PMI normal Abdomen: benighn, BS positve, no tenderness, no AAA no bruit.  No HSM or  HJR Distal pulses intact with no bruits No edema Neuro non-focal Skin warm and dry Post bilateral THR;s  . ASSESSMENT & PLAN:    1. Paroxysmal Atrial Fibrillation - not on anticoagulation due to anemia and failure to have GI evaluation CHADVASC 3   She also refuses to start DOAC or other blood thinner Decreased lopressor to 12.5 mg bid  CHADVASC 3   2. Lower Extremity Edema - dependant continue diuretic   3. Anemia - Hct 39.7 08/14/23   4. Pulmonary Nodules -  04/19/19 Chest CT showed scattered tiny pulmonary nodules, largest 2 mm with follow-up non-contrast chest CT recommended in 12 months per primary care physician She refuses to have f/u study   COVID-19 Education: The signs and symptoms of COVID-19 were discussed with the patient and how to seek care for testing (follow up with PCP or arrange E-visit). The importance of social distancing was discussed today.   Medication Adjustments/Labs and Tests Ordered: Current medicines  are reviewed at length with the patient today.  Concerns regarding medicines are outlined above.   Tests Ordered:  None   Medication Changes: No orders of the defined types were placed in this encounter.    Follow Up: in a year   Signed, Charlton Haws, MD  08/24/2023 10:47 AM    [facility]

## 2023-08-20 DIAGNOSIS — R918 Other nonspecific abnormal finding of lung field: Secondary | ICD-10-CM | POA: Diagnosis not present

## 2023-08-20 DIAGNOSIS — I129 Hypertensive chronic kidney disease with stage 1 through stage 4 chronic kidney disease, or unspecified chronic kidney disease: Secondary | ICD-10-CM | POA: Diagnosis not present

## 2023-08-20 DIAGNOSIS — I700 Atherosclerosis of aorta: Secondary | ICD-10-CM | POA: Diagnosis not present

## 2023-08-20 DIAGNOSIS — R809 Proteinuria, unspecified: Secondary | ICD-10-CM | POA: Diagnosis not present

## 2023-08-20 DIAGNOSIS — E1122 Type 2 diabetes mellitus with diabetic chronic kidney disease: Secondary | ICD-10-CM | POA: Diagnosis not present

## 2023-08-20 DIAGNOSIS — Z96643 Presence of artificial hip joint, bilateral: Secondary | ICD-10-CM | POA: Diagnosis not present

## 2023-08-20 DIAGNOSIS — D509 Iron deficiency anemia, unspecified: Secondary | ICD-10-CM | POA: Diagnosis not present

## 2023-08-20 DIAGNOSIS — R600 Localized edema: Secondary | ICD-10-CM | POA: Diagnosis not present

## 2023-08-20 DIAGNOSIS — I480 Paroxysmal atrial fibrillation: Secondary | ICD-10-CM | POA: Diagnosis not present

## 2023-08-20 DIAGNOSIS — I10 Essential (primary) hypertension: Secondary | ICD-10-CM | POA: Diagnosis not present

## 2023-08-20 DIAGNOSIS — Z2820 Immunization not carried out because of patient decision for unspecified reason: Secondary | ICD-10-CM | POA: Diagnosis not present

## 2023-08-24 ENCOUNTER — Ambulatory Visit: Payer: Medicare Other | Attending: Cardiovascular Disease | Admitting: Cardiovascular Disease

## 2023-08-24 ENCOUNTER — Encounter: Payer: Self-pay | Admitting: Cardiovascular Disease

## 2023-08-24 VITALS — BP 140/84 | HR 71 | Ht 61.5 in | Wt 165.6 lb

## 2023-08-24 DIAGNOSIS — I480 Paroxysmal atrial fibrillation: Secondary | ICD-10-CM

## 2023-08-24 NOTE — Patient Instructions (Signed)
Medication Instructions:  Your physician recommends that you continue on your current medications as directed. Please refer to the Current Medication list given to you today.  *If you need a refill on your cardiac medications before your next appointment, please call your pharmacy*   Lab Work: NONE   If you have labs (blood work) drawn today and your tests are completely normal, you will receive your results only by: MyChart Message (if you have MyChart) OR A paper copy in the mail If you have any lab test that is abnormal or we need to change your treatment, we will call you to review the results.   Testing/Procedures: NONE    Follow-Up: At [facility], you and your health needs are our priority.  As part of our continuing mission to provide you with exceptional heart care, we have created designated Provider Care Teams.  These Care Teams include your primary Cardiologist (physician) and Advanced Practice Providers (APPs -  Physician Assistants and Nurse Practitioners) who all work together to provide you with the care you need, when you need it.  We recommend signing up for the patient portal called "MyChart".  Sign up information is provided on this After Visit Summary.  MyChart is used to connect with patients for Virtual Visits (Telemedicine).  Patients are able to view lab/test results, encounter notes, upcoming appointments, etc.  Non-urgent messages can be sent to your provider as well.   To learn more about what you can do with MyChart, go to [email or web address].    Your next appointment:   1 year(s)  Provider:   You may see Peter Nishan, MD or one of the following Advanced Practice Providers on your designated Care Team:   Brittany Strader, PA-C  Michele Lenze, PA-C     Other Instructions Thank you for choosing [facility]!    

## 2023-11-11 DIAGNOSIS — R600 Localized edema: Secondary | ICD-10-CM | POA: Diagnosis not present

## 2023-11-11 DIAGNOSIS — I10 Essential (primary) hypertension: Secondary | ICD-10-CM | POA: Diagnosis not present

## 2023-11-11 DIAGNOSIS — I480 Paroxysmal atrial fibrillation: Secondary | ICD-10-CM | POA: Diagnosis not present

## 2023-11-11 DIAGNOSIS — Z713 Dietary counseling and surveillance: Secondary | ICD-10-CM | POA: Diagnosis not present

## 2023-11-11 DIAGNOSIS — Z79899 Other long term (current) drug therapy: Secondary | ICD-10-CM | POA: Diagnosis not present

## 2023-12-31 ENCOUNTER — Other Ambulatory Visit: Payer: Self-pay | Admitting: Cardiovascular Disease

## 2024-02-12 DIAGNOSIS — D509 Iron deficiency anemia, unspecified: Secondary | ICD-10-CM | POA: Diagnosis not present

## 2024-02-12 DIAGNOSIS — E1122 Type 2 diabetes mellitus with diabetic chronic kidney disease: Secondary | ICD-10-CM | POA: Diagnosis not present

## 2024-02-12 DIAGNOSIS — I480 Paroxysmal atrial fibrillation: Secondary | ICD-10-CM | POA: Diagnosis not present

## 2024-02-17 DIAGNOSIS — I129 Hypertensive chronic kidney disease with stage 1 through stage 4 chronic kidney disease, or unspecified chronic kidney disease: Secondary | ICD-10-CM | POA: Diagnosis not present

## 2024-02-17 DIAGNOSIS — I10 Essential (primary) hypertension: Secondary | ICD-10-CM | POA: Diagnosis not present

## 2024-02-17 DIAGNOSIS — I480 Paroxysmal atrial fibrillation: Secondary | ICD-10-CM | POA: Diagnosis not present

## 2024-02-17 DIAGNOSIS — R600 Localized edema: Secondary | ICD-10-CM | POA: Diagnosis not present

## 2024-03-10 DIAGNOSIS — Z79899 Other long term (current) drug therapy: Secondary | ICD-10-CM | POA: Diagnosis not present

## 2024-03-10 DIAGNOSIS — R600 Localized edema: Secondary | ICD-10-CM | POA: Diagnosis not present

## 2024-03-10 DIAGNOSIS — Z713 Dietary counseling and surveillance: Secondary | ICD-10-CM | POA: Diagnosis not present

## 2024-03-10 DIAGNOSIS — N1831 Chronic kidney disease, stage 3a: Secondary | ICD-10-CM | POA: Diagnosis not present

## 2024-03-10 DIAGNOSIS — I10 Essential (primary) hypertension: Secondary | ICD-10-CM | POA: Diagnosis not present

## 2024-03-10 DIAGNOSIS — I129 Hypertensive chronic kidney disease with stage 1 through stage 4 chronic kidney disease, or unspecified chronic kidney disease: Secondary | ICD-10-CM | POA: Diagnosis not present

## 2024-03-31 DIAGNOSIS — R600 Localized edema: Secondary | ICD-10-CM | POA: Diagnosis not present

## 2024-03-31 DIAGNOSIS — N1831 Chronic kidney disease, stage 3a: Secondary | ICD-10-CM | POA: Diagnosis not present

## 2024-03-31 DIAGNOSIS — I129 Hypertensive chronic kidney disease with stage 1 through stage 4 chronic kidney disease, or unspecified chronic kidney disease: Secondary | ICD-10-CM | POA: Diagnosis not present

## 2024-03-31 DIAGNOSIS — E876 Hypokalemia: Secondary | ICD-10-CM | POA: Diagnosis not present

## 2024-03-31 DIAGNOSIS — I10 Essential (primary) hypertension: Secondary | ICD-10-CM | POA: Diagnosis not present

## 2024-06-08 ENCOUNTER — Other Ambulatory Visit: Payer: Self-pay | Admitting: Cardiovascular Disease

## 2024-06-20 DIAGNOSIS — I10 Essential (primary) hypertension: Secondary | ICD-10-CM | POA: Diagnosis not present

## 2024-06-23 ENCOUNTER — Encounter: Payer: Self-pay | Admitting: Internal Medicine

## 2024-06-23 DIAGNOSIS — D509 Iron deficiency anemia, unspecified: Secondary | ICD-10-CM | POA: Diagnosis not present

## 2024-06-23 DIAGNOSIS — R809 Proteinuria, unspecified: Secondary | ICD-10-CM | POA: Diagnosis not present

## 2024-06-23 DIAGNOSIS — R918 Other nonspecific abnormal finding of lung field: Secondary | ICD-10-CM | POA: Diagnosis not present

## 2024-06-23 DIAGNOSIS — E1122 Type 2 diabetes mellitus with diabetic chronic kidney disease: Secondary | ICD-10-CM | POA: Diagnosis not present

## 2024-06-23 DIAGNOSIS — I700 Atherosclerosis of aorta: Secondary | ICD-10-CM | POA: Diagnosis not present

## 2024-06-23 DIAGNOSIS — E876 Hypokalemia: Secondary | ICD-10-CM | POA: Diagnosis not present

## 2024-06-23 DIAGNOSIS — I480 Paroxysmal atrial fibrillation: Secondary | ICD-10-CM | POA: Diagnosis not present

## 2024-06-23 DIAGNOSIS — Z96643 Presence of artificial hip joint, bilateral: Secondary | ICD-10-CM | POA: Diagnosis not present

## 2024-06-23 DIAGNOSIS — I129 Hypertensive chronic kidney disease with stage 1 through stage 4 chronic kidney disease, or unspecified chronic kidney disease: Secondary | ICD-10-CM | POA: Diagnosis not present

## 2024-06-23 DIAGNOSIS — I10 Essential (primary) hypertension: Secondary | ICD-10-CM | POA: Diagnosis not present

## 2024-06-23 DIAGNOSIS — N1831 Chronic kidney disease, stage 3a: Secondary | ICD-10-CM | POA: Diagnosis not present

## 2025-01-06 ENCOUNTER — Ambulatory Visit: Admitting: Cardiovascular Disease
